# Patient Record
Sex: Male | Born: 1950 | Race: White | Hispanic: No | Marital: Married | Smoking: Current some day smoker
Health system: Southern US, Community
[De-identification: ages and names within clinical notes are randomized; demographics above are authoritative.]

## PROBLEM LIST (undated history)

## (undated) DIAGNOSIS — I10 Essential (primary) hypertension: Secondary | ICD-10-CM

## (undated) HISTORY — PX: COLONOSCOPY: SHX174

## (undated) HISTORY — PX: OTHER SURGICAL HISTORY: SHX169

## (undated) HISTORY — PX: TONSILLECTOMY: SUR1361

## (undated) HISTORY — DX: Essential (primary) hypertension: I10

---

## 2006-02-08 ENCOUNTER — Emergency Department (HOSPITAL_COMMUNITY): Admission: EM | Admit: 2006-02-08 | Discharge: 2006-02-08 | Payer: Self-pay | Admitting: Emergency Medicine

## 2006-02-14 ENCOUNTER — Ambulatory Visit: Payer: Self-pay | Admitting: Orthopedic Surgery

## 2006-05-12 ENCOUNTER — Encounter: Admission: RE | Admit: 2006-05-12 | Discharge: 2006-05-12 | Payer: Self-pay | Admitting: Neurosurgery

## 2006-05-30 ENCOUNTER — Ambulatory Visit (HOSPITAL_COMMUNITY): Admission: RE | Admit: 2006-05-30 | Discharge: 2006-05-30 | Payer: Self-pay | Admitting: Neurosurgery

## 2008-08-29 IMAGING — CT CT CERVICAL SPINE W/O CM
2 of 7 series · 11 of 28 positions shown, 13 images · non-contrast
Comparison: none

CLINICAL DATA: Trauma

[Series 104: cervical spine · axial · 0.27mm/px · z∈[-297,-154]mm · 6 of 321 slices shown, 8 images]
[im 46/321  soft-tissue]
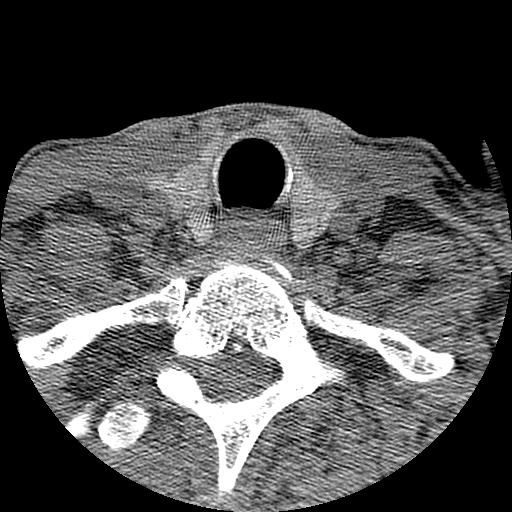
[im 46/321  bone]
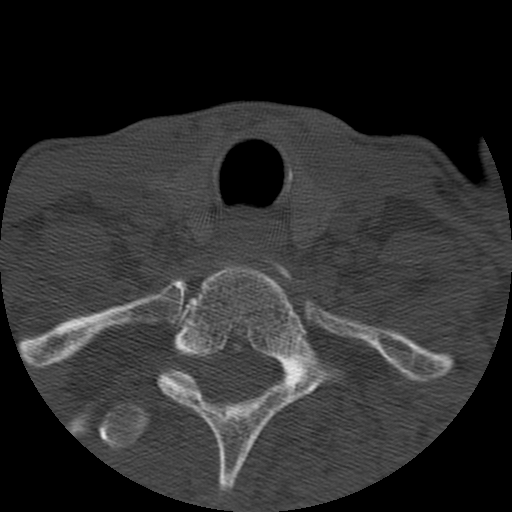
[im 92/321  bone]
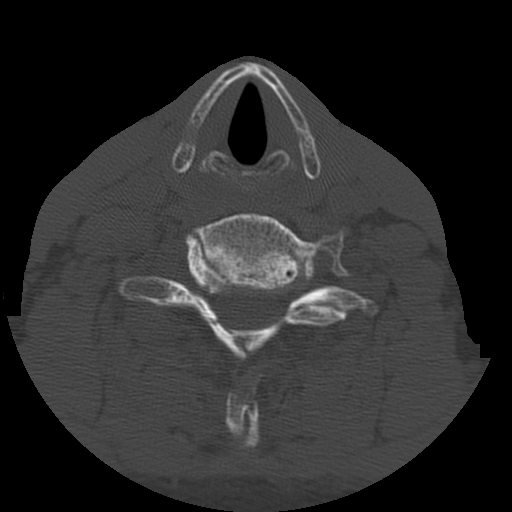
[im 138/321  bone]
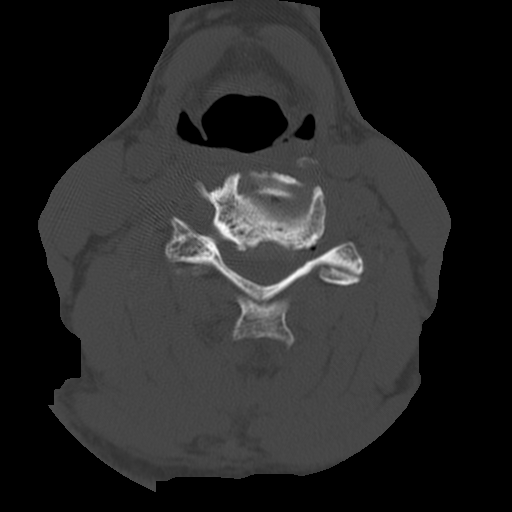
[im 183/321  bone]
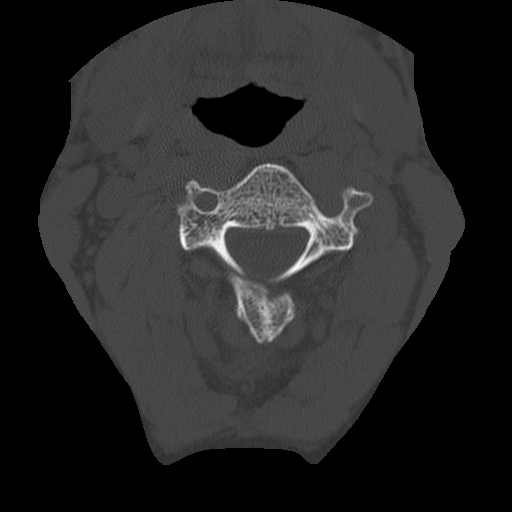
[im 229/321  soft-tissue]
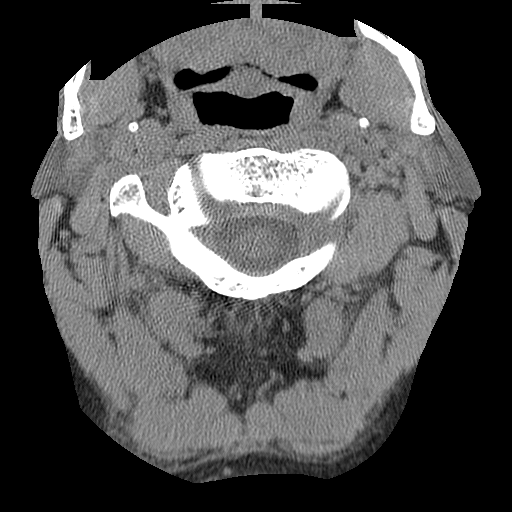
[im 229/321  bone]
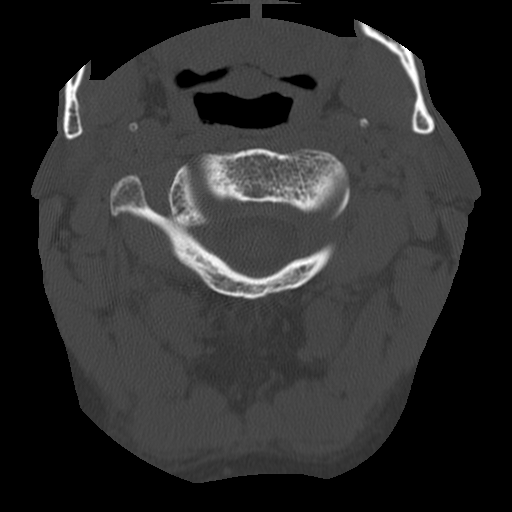
[im 275/321  bone]
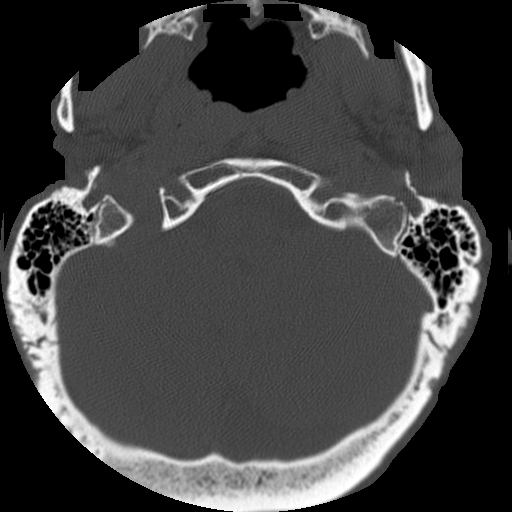

[Series 106: sagittal c spine · sagittal · 0.40mm/px · 5 of 45 slices shown]
[im 8/45  bone]
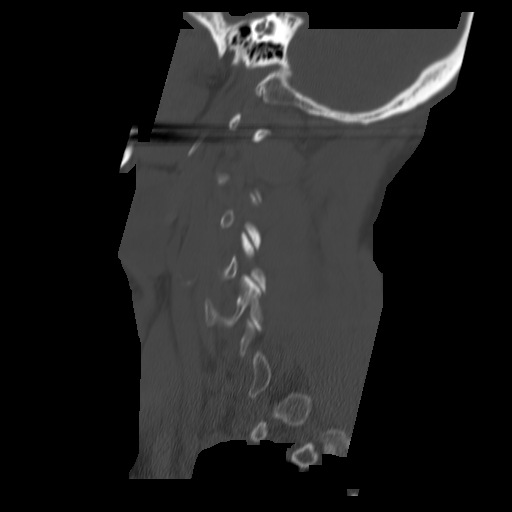
[im 15/45  bone]
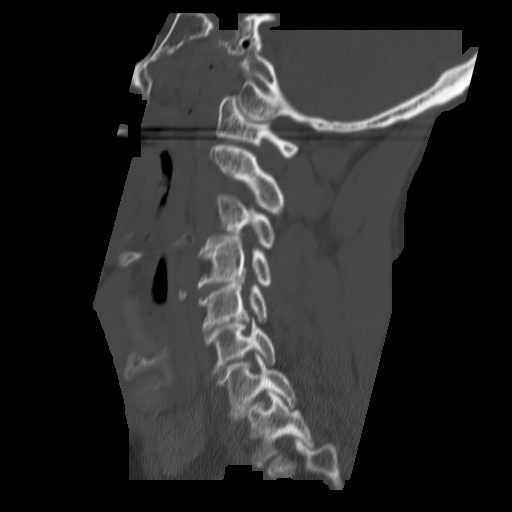
[im 23/45  bone]
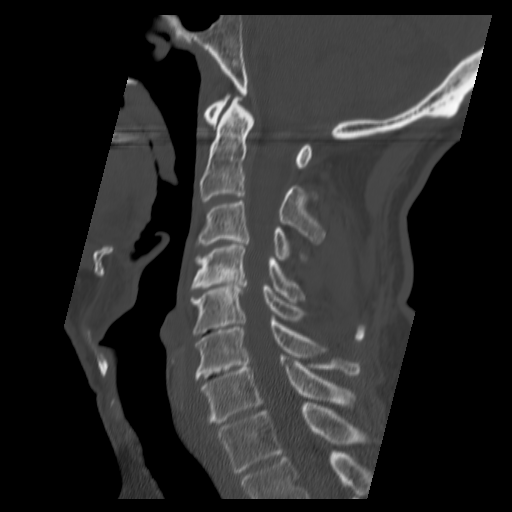
[im 30/45  bone]
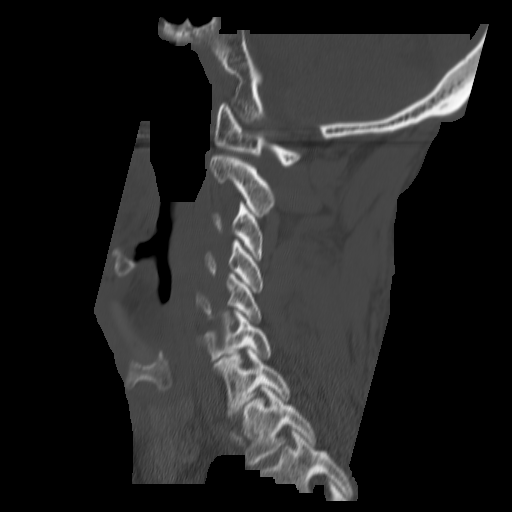
[im 37/45  bone]
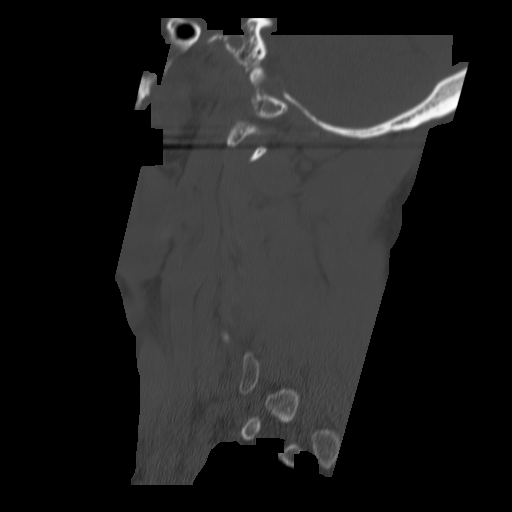

[11 of 28 positions shown; findings below may reference images not displayed]

CT head without contrast:

No previous for comparison. Probable arachnoid cyst posteriorly in the left
posterior cranial fossa. There is no evidence of acute intracranial hemorrhage,
brain edema,  mass effect, or midline shift.   No other intra-axial
abnormalities are seen, and the ventricles and sulci are within normal limits in
size and symmetry.   No abnormal extra-axial fluid collections or masses are
identified.  No skull abnormalities are noted.
IMPRESSION: 1. Negative non-contrast head CT.

CT cervical spine without contrast:

Multidetector axial helical CT with coronal and sagittal reconstructions. Mild
narrowings C3-C4 with a posterior disc bulge. Narrowing of the interspace C4-C5,
with moderate circumferential disc bulge and associated endplate spurring. There
may be a superimposed broad disc protrusion resulting contributing to spinal
stenosis. Uncovertebral hypertrophy results in some encroachment on neural
foraminal bilaterally.

C5-C6 also has somewhat narrowed interspace with circumferential disc bulge and
endplate spurring. Uncovertebral and facet degenerative hypertrophy result in  
neural foraminal encroachment, left greater than right. 

There is fracture of the spinous process of C6 mildly distracted without
extension to the lamina. This is best seen on the sagittal reconstructions.
There is a fracture involving the right lamina and base of the spinous process
of C7, minimally displaced. The fracture line extends across to left lamina.
There is no extension into the facet joint or pedicle. The vertebral body
remains intact. Narrowing of C6-C7 interspace with endplate spurring.
Uncovertebral hypertrophy results in some neural foramina encroachment
bilaterally.

At C7-T1 is mild narrowing and endplate spurring with uncovertebral hypertrophy
resulting in right-sided foraminal encroachment. There is normal alignment of
the cervical spine. No prevertebral soft tissue swelling.
IMPRESSION: 1. Fractures involving spinous process of C6 and posterior elements of C7 as
detailed above.
2. Degenerative disc and facet disease at multiple levels as enumerated above.
This results in spinal stenosis most marked C4-C5, with possible disc protrusion
superimposed on degenerative stenosis.

## 2008-12-18 IMAGING — CR DG CERVICAL SPINE FLEX&EXT ONLY
2 series · 2 of 2 positions shown · non-contrast
Comparison: CT 05/12/2006

CLINICAL DATA: Preop C4-C5, C6-C7 ACDF

CERVICAL SPINE - 2 VIEW

[view not recorded (1 of 2)]
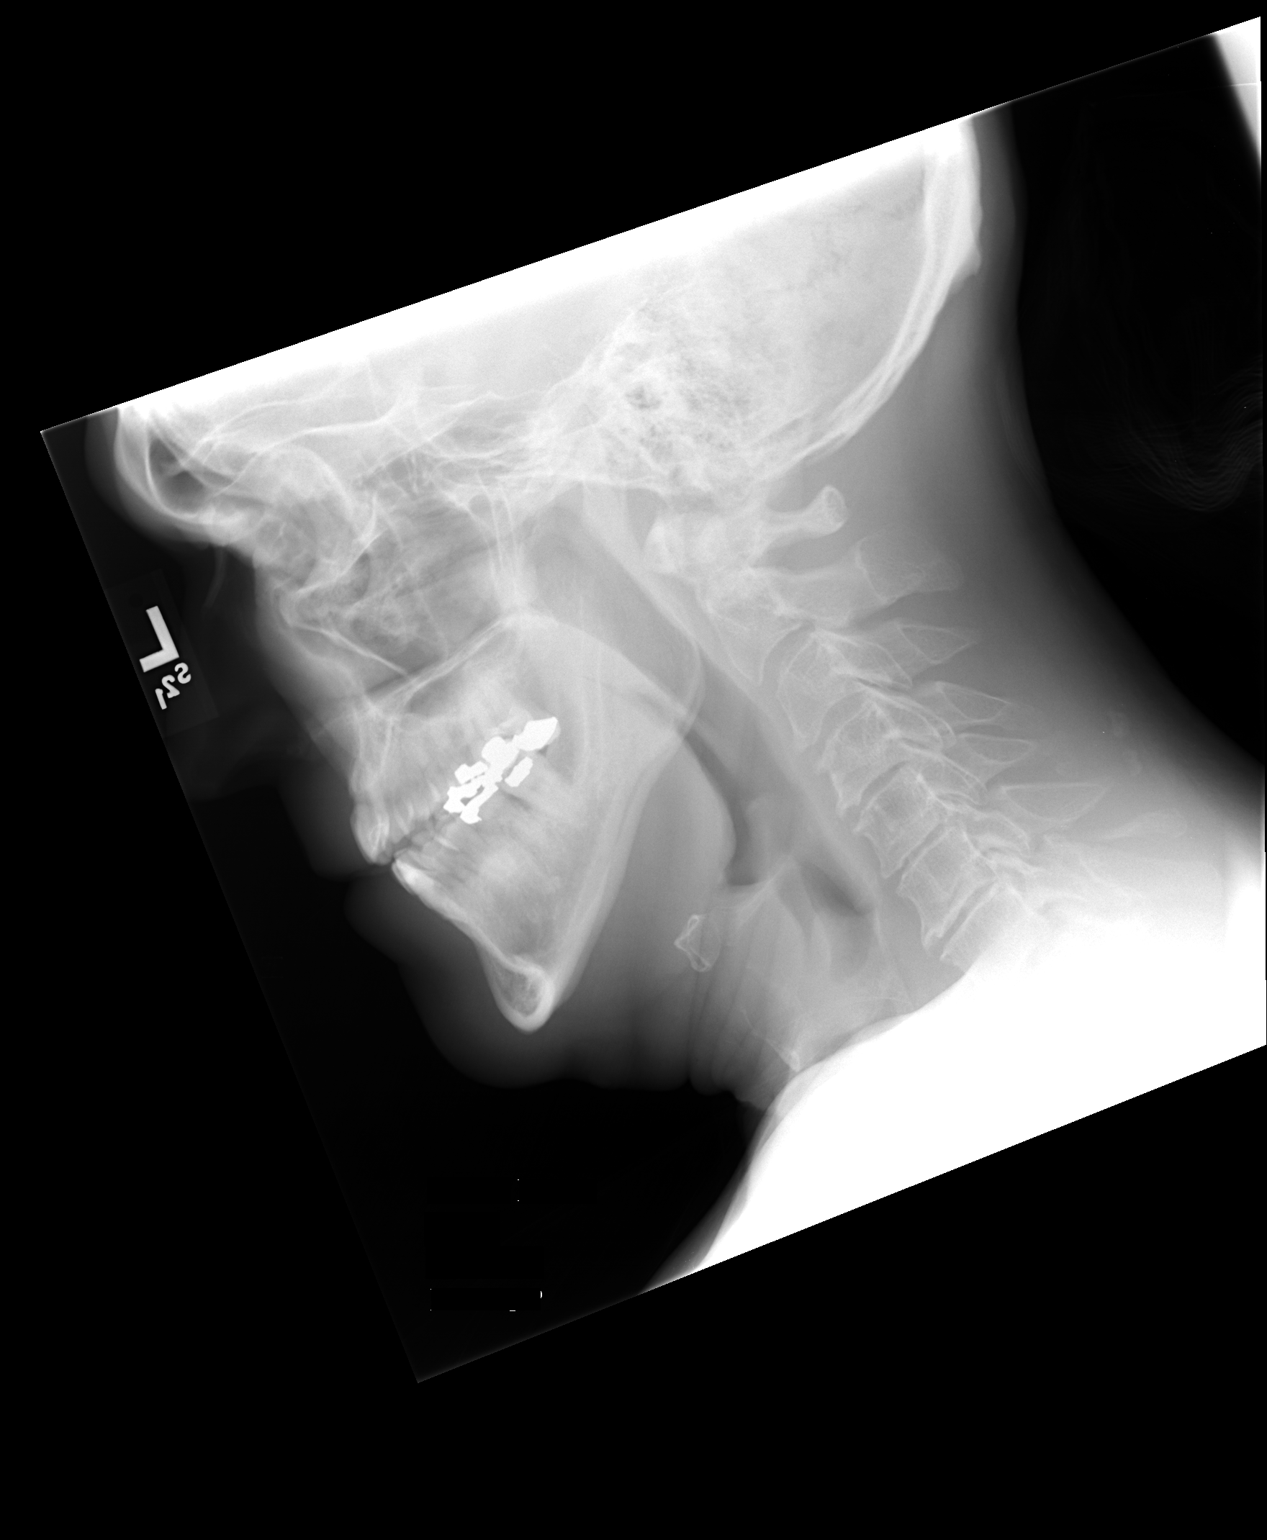

[view not recorded (2 of 2)]
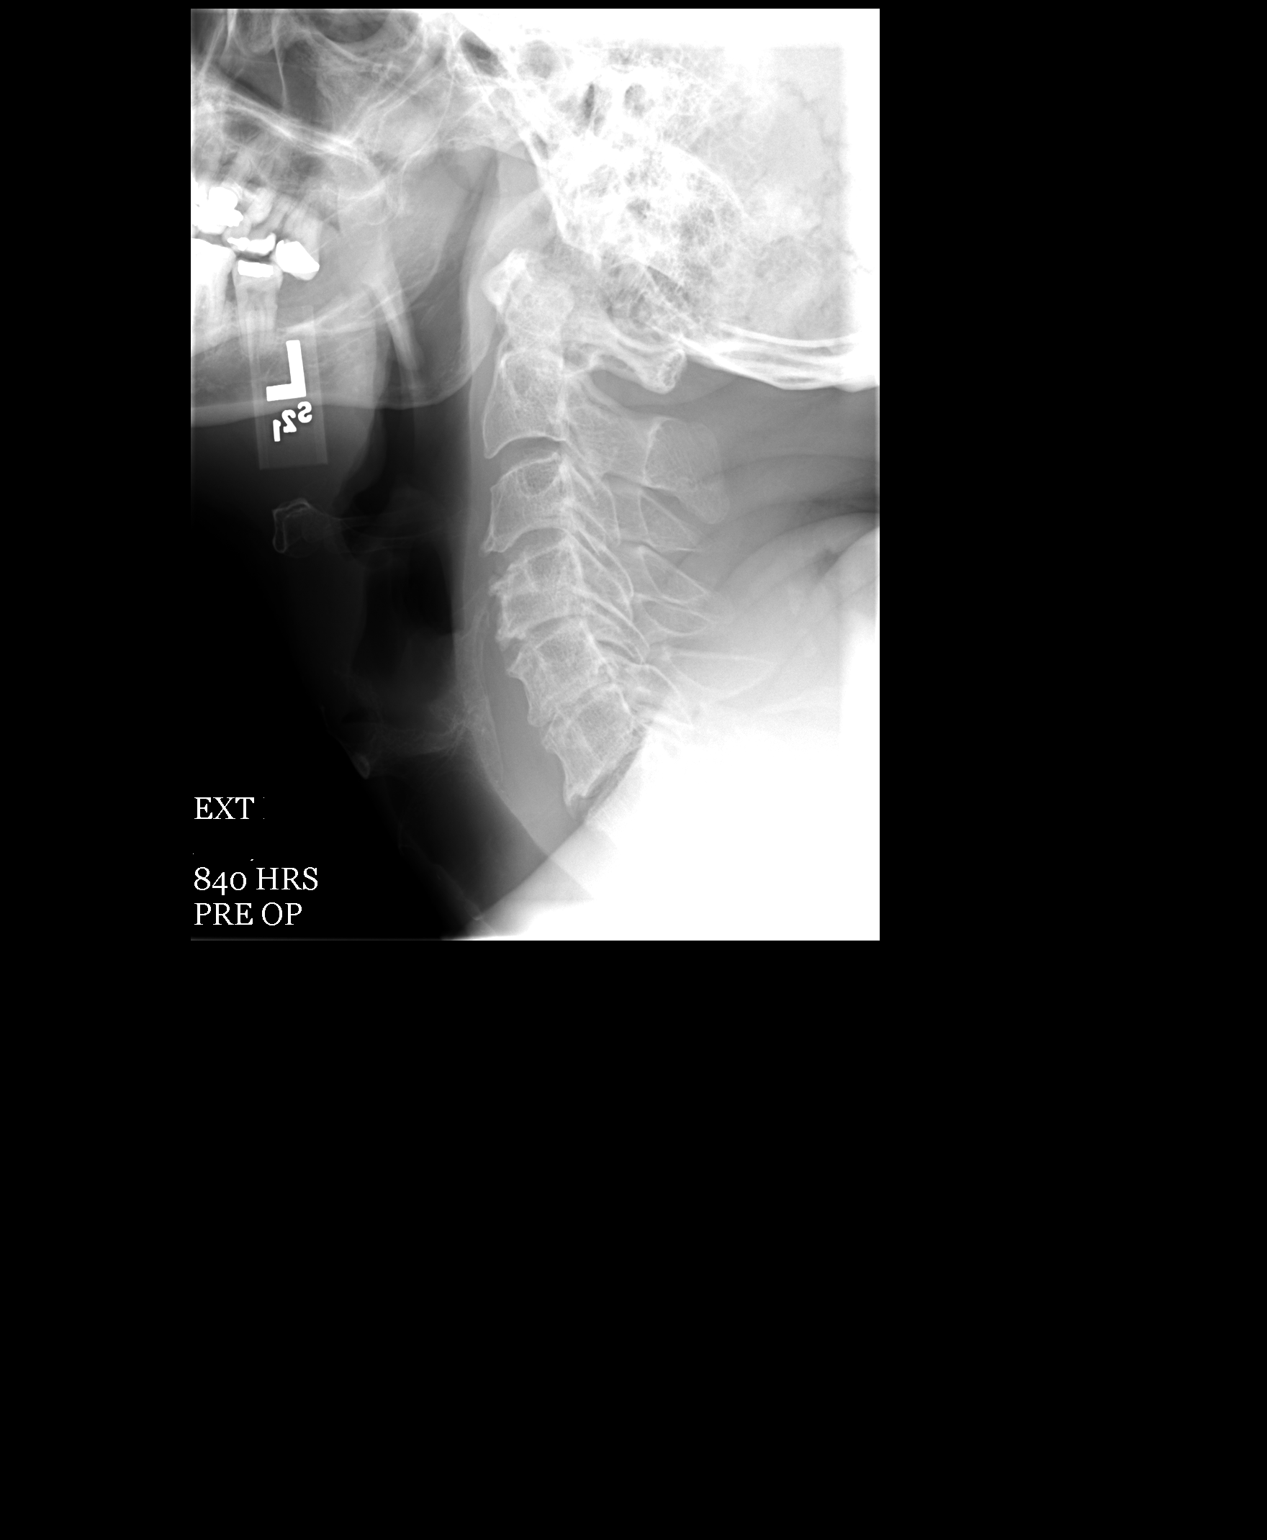

[2 of 2 positions shown; findings below may reference images not displayed]

FINDINGS: Previously noted spinous process and posterior element fractures at
C6 and C7 are noted on the flexion view. There is no instability seen with
limited flexion. There is better extension, again no evidence of instability.
Diffuse spondylosis noted, most pronounced at C4-C5, C5-C6, and C6-C7.

IMPRESSION

No instability with extension or limited flexion.

## 2014-03-03 ENCOUNTER — Encounter: Payer: Self-pay | Admitting: Gastroenterology

## 2019-11-29 ENCOUNTER — Encounter: Payer: Self-pay | Admitting: Internal Medicine

## 2020-01-13 ENCOUNTER — Ambulatory Visit (AMBULATORY_SURGERY_CENTER): Payer: Self-pay | Admitting: *Deleted

## 2020-01-13 ENCOUNTER — Other Ambulatory Visit: Payer: Self-pay

## 2020-01-13 VITALS — Ht 68.0 in | Wt 143.0 lb

## 2020-01-13 DIAGNOSIS — Z1211 Encounter for screening for malignant neoplasm of colon: Secondary | ICD-10-CM

## 2020-01-13 NOTE — Progress Notes (Signed)

## 2020-01-20 ENCOUNTER — Encounter: Payer: Self-pay | Admitting: Internal Medicine

## 2020-01-31 ENCOUNTER — Other Ambulatory Visit: Payer: Self-pay

## 2020-01-31 ENCOUNTER — Ambulatory Visit (AMBULATORY_SURGERY_CENTER): Payer: 59 | Admitting: Internal Medicine

## 2020-01-31 ENCOUNTER — Encounter: Payer: Self-pay | Admitting: Internal Medicine

## 2020-01-31 VITALS — BP 145/76 | HR 71 | Temp 98.1°F | Resp 15 | Ht 68.0 in | Wt 143.0 lb

## 2020-01-31 DIAGNOSIS — Z1211 Encounter for screening for malignant neoplasm of colon: Secondary | ICD-10-CM | POA: Diagnosis present

## 2020-01-31 DIAGNOSIS — D128 Benign neoplasm of rectum: Secondary | ICD-10-CM

## 2020-01-31 DIAGNOSIS — D129 Benign neoplasm of anus and anal canal: Secondary | ICD-10-CM

## 2020-01-31 DIAGNOSIS — D123 Benign neoplasm of transverse colon: Secondary | ICD-10-CM

## 2020-01-31 MED ORDER — SODIUM CHLORIDE 0.9 % IV SOLN: 500.0000 mL | Freq: Once | INTRAVENOUS | Status: DC

## 2020-01-31 NOTE — Progress Notes (Signed)
Called to room to assist during endoscopic procedure.  Patient ID and intended procedure confirmed with present staff. Received instructions for my participation in the procedure from the performing physician.  

## 2020-01-31 NOTE — Op Note (Signed)
[place] Patient Name: Logan Carpenter Procedure Date: 01/31/2020 7:57 AM MRN: [id number] Endoscopist: Gatha Mayer , MD Age: 69 Referring MD:  Date of Birth: [birth year] Gender: Male Account #: [id number] Procedure:                Colonoscopy Indications:              Screening for colorectal malignant neoplasm, Last                            colonoscopy: 2005 Medicines:                Propofol per Anesthesia, Monitored Anesthesia Care Procedure:                Pre-Anesthesia Assessment:                           - Prior to the procedure, a History and Physical                            was performed, and patient medications and                            allergies were reviewed. The patient's tolerance of                            previous anesthesia was also reviewed. The risks                            and benefits of the procedure and the sedation                            options and risks were discussed with the patient.                            All questions were answered, and informed consent                            was obtained. Prior Anticoagulants: The patient has                            taken no previous anticoagulant or antiplatelet                            agents. ASA Grade Assessment: II - A patient with                            mild systemic disease. After reviewing the risks                            and benefits, the patient was deemed in                            satisfactory condition to undergo the procedure.  After obtaining informed consent, the colonoscope                            was passed under direct vision. Throughout the                            procedure, the patient's blood pressure, pulse, and                            oxygen saturations were monitored continuously. The                            Olympus PFC-[id number] (#[id number]) Colonoscope was                            introduced through the  anus and advanced to the the                            cecum, identified by appendiceal orifice and                            ileocecal valve. The colonoscopy was performed                            without difficulty. The patient tolerated the                            procedure well. The quality of the bowel                            preparation was adequate. The ileocecal valve,                            appendiceal orifice, and rectum were photographed.                            The bowel preparation used was Miralax via split                            dose instruction. Scope In: 8:14:34 AM Scope Out: 8:44:54 AM Scope Withdrawal Time: 0 hours 23 minutes 16 seconds  Total Procedure Duration: 0 hours 30 minutes 20 seconds  Findings:                 The perianal and digital rectal examinations were                            normal. Pertinent negatives include normal prostate                            (size, shape, and consistency).                           Two sessile polyps were found in the rectum and  transverse colon. The polyps were diminutive in                            size. These polyps were removed with a cold snare.                            Resection and retrieval were complete. Verification                            of patient identification for the specimen was                            done. Estimated blood loss was minimal.                           Multiple diverticula were found in the sigmoid                            colon.                           The exam was otherwise without abnormality.                           No additional abnormalities were found on                            retroflexion. Complications:            No immediate complications. Estimated Blood Loss:     Estimated blood loss was minimal. Impression:               - Two diminutive polyps in the rectum and in the                            transverse  colon, removed with a cold snare.                            Resected and retrieved.                           - Diverticulosis in the sigmoid colon.                           - The examination was otherwise normal. Recommendation:           - Patient has a contact number available for                            emergencies. The signs and symptoms of potential                            delayed complications were discussed with the                            patient. Return to normal activities tomorrow.  Written discharge instructions were provided to the                            patient.                           - Resume previous diet.                           - Continue present medications.                           - Repeat colonoscopy is recommended. The                            colonoscopy date will be determined after pathology                            results from today's exam become available for                            review. Gatha Mayer, MD 01/31/2020 8:53:30 AM This report has been signed electronically.

## 2020-01-31 NOTE — Progress Notes (Signed)
Report to PACU, RN, vss, BBS= Clear.  

## 2020-01-31 NOTE — Progress Notes (Signed)
Vital signs checked by:CW  The patient states no changes in medical or surgical history since pre-visit screening on 01/13/2020.

## 2020-01-31 NOTE — Patient Instructions (Addendum)
I found and removed 2 tiny polyps that look benign.  I will let you know pathology results and when to have another routine colonoscopy by mail and/or My Chart.  You also have a condition called diverticulosis - common and not usually a problem. Please read the handout provided.  I appreciate the opportunity to care for you. Gatha Mayer, MD, FACG  YOU HAD AN ENDOSCOPIC PROCEDURE TODAY AT [facility]:   Refer to the procedure report that was given to you for any specific questions about what was found during the examination.  If the procedure report does not answer your questions, please call your gastroenterologist to clarify.  If you requested that your care partner not be given the details of your procedure findings, then the procedure report has been included in a sealed envelope for you to review at your convenience later.  YOU SHOULD EXPECT: Some feelings of bloating in the abdomen. Passage of more gas than usual.  Walking can help get rid of the air that was put into your GI tract during the procedure and reduce the bloating. If you had a lower endoscopy (such as a colonoscopy or flexible sigmoidoscopy) you may notice spotting of blood in your stool or on the toilet paper. If you underwent a bowel prep for your procedure, you may not have a normal bowel movement for a few days.  Please Note:  You might notice some irritation and congestion in your nose or some drainage.  This is from the oxygen used during your procedure.  There is no need for concern and it should clear up in a day or so.  SYMPTOMS TO REPORT IMMEDIATELY:   Following lower endoscopy (colonoscopy or flexible sigmoidoscopy):  Excessive amounts of blood in the stool  Significant tenderness or worsening of abdominal pains  Swelling of the abdomen that is new, acute  Fever of 100F or higher   Following upper endoscopy (EGD)  Vomiting of blood or coffee ground material  New chest pain or pain under  the shoulder blades  Painful or persistently difficult swallowing  New shortness of breath  Fever of 100F or higher  Black, tarry-looking stools  For urgent or emergent issues, a gastroenterologist can be reached at any hour by calling [phone]. Do not use MyChart messaging for urgent concerns.    DIET:  We do recommend a small meal at first, but then you may proceed to your regular diet.  Drink plenty of fluids but you should avoid alcoholic beverages for 24 hours.  ACTIVITY:  You should plan to take it easy for the rest of today and you should NOT DRIVE or use heavy machinery until tomorrow (because of the sedation medicines used during the test).    FOLLOW UP: Our staff will call the number listed on your records 48-72 hours following your procedure to check on you and address any questions or concerns that you may have regarding the information given to you following your procedure. If we do not reach you, we will leave a message.  We will attempt to reach you two times.  During this call, we will ask if you have developed any symptoms of COVID 19. If you develop any symptoms (ie: fever, flu-like symptoms, shortness of breath, cough etc.) before then, please call [phone].  If you test positive for Covid 19 in the 2 weeks post procedure, please call and report this information to [place].    If any biopsies were taken you  will be contacted by phone or by letter within the next 1-3 weeks.  Please call us at [phone] if you have not heard about the biopsies in 3 weeks.    SIGNATURES/CONFIDENTIALITY: You and/or your care partner have signed paperwork which will be entered into your electronic medical record.  These signatures attest to the fact that that the information above on your After Visit Summary has been reviewed and is understood.  Full responsibility of the confidentiality of this discharge information lies with you and/or your care-partner.

## 2020-02-01 ENCOUNTER — Telehealth: Payer: Self-pay

## 2020-02-01 NOTE — Telephone Encounter (Signed)
LVM

## 2020-02-03 ENCOUNTER — Encounter: Payer: Self-pay | Admitting: Internal Medicine

## 2020-02-03 DIAGNOSIS — Z860101 Personal history of adenomatous and serrated colon polyps: Secondary | ICD-10-CM

## 2020-02-03 DIAGNOSIS — Z86010 Personal history of colonic polyps: Secondary | ICD-10-CM

## 2020-02-03 HISTORY — DX: Personal history of colonic polyps: Z86.010

## 2020-02-03 HISTORY — DX: Personal history of adenomatous and serrated colon polyps: Z86.0101

## 2023-10-14 ENCOUNTER — Other Ambulatory Visit: Payer: Self-pay

## 2023-10-14 ENCOUNTER — Emergency Department

## 2023-10-14 ENCOUNTER — Inpatient Hospital Stay
Admission: EM | Admit: 2023-10-14 | Discharge: 2023-10-16 | DRG: 200 | Disposition: A | Discharge status: Home / Self Care | Source: Ambulatory Visit | Attending: Student | Admitting: Student

## 2023-10-14 DIAGNOSIS — Z66 Do not resuscitate: Secondary | ICD-10-CM | POA: Diagnosis present

## 2023-10-14 DIAGNOSIS — S2241XA Multiple fractures of ribs, right side, initial encounter for closed fracture: Secondary | ICD-10-CM | POA: Diagnosis present

## 2023-10-14 DIAGNOSIS — Z79899 Other long term (current) drug therapy: Secondary | ICD-10-CM | POA: Diagnosis not present

## 2023-10-14 DIAGNOSIS — Z860101 Personal history of adenomatous and serrated colon polyps: Secondary | ICD-10-CM

## 2023-10-14 DIAGNOSIS — E860 Dehydration: Secondary | ICD-10-CM | POA: Diagnosis present

## 2023-10-14 DIAGNOSIS — J432 Centrilobular emphysema: Secondary | ICD-10-CM | POA: Diagnosis present

## 2023-10-14 DIAGNOSIS — D72829 Elevated white blood cell count, unspecified: Secondary | ICD-10-CM | POA: Diagnosis not present

## 2023-10-14 DIAGNOSIS — I10 Essential (primary) hypertension: Secondary | ICD-10-CM | POA: Diagnosis present

## 2023-10-14 DIAGNOSIS — S270XXA Traumatic pneumothorax, initial encounter: Secondary | ICD-10-CM | POA: Diagnosis present

## 2023-10-14 DIAGNOSIS — Z87891 Personal history of nicotine dependence: Secondary | ICD-10-CM

## 2023-10-14 DIAGNOSIS — R918 Other nonspecific abnormal finding of lung field: Secondary | ICD-10-CM | POA: Diagnosis present

## 2023-10-14 DIAGNOSIS — Y92018 Other place in single-family (private) house as the place of occurrence of the external cause: Secondary | ICD-10-CM

## 2023-10-14 DIAGNOSIS — Y93E9 Activity, other interior property and clothing maintenance: Secondary | ICD-10-CM | POA: Diagnosis not present

## 2023-10-14 DIAGNOSIS — J90 Pleural effusion, not elsewhere classified: Secondary | ICD-10-CM | POA: Diagnosis present

## 2023-10-14 DIAGNOSIS — J939 Pneumothorax, unspecified: Secondary | ICD-10-CM | POA: Diagnosis present

## 2023-10-14 DIAGNOSIS — R079 Chest pain, unspecified: Secondary | ICD-10-CM | POA: Diagnosis present

## 2023-10-14 DIAGNOSIS — D72828 Other elevated white blood cell count: Secondary | ICD-10-CM | POA: Diagnosis present

## 2023-10-14 DIAGNOSIS — W1839XA Other fall on same level, initial encounter: Secondary | ICD-10-CM | POA: Diagnosis present

## 2023-10-14 DIAGNOSIS — S2241XD Multiple fractures of ribs, right side, subsequent encounter for fracture with routine healing: Principal | ICD-10-CM

## 2023-10-14 LAB — CBC WITH DIFFERENTIAL/PLATELET
Abs Immature Granulocytes: 0.08 K/uL — ABNORMAL HIGH (ref 0.00–0.07)
Basophils Absolute: 0 K/uL (ref 0.0–0.1)
Basophils Relative: 0 %
Eosinophils Absolute: 0 K/uL (ref 0.0–0.5)
Eosinophils Relative: 0 %
HCT: 43.8 % (ref 39.0–52.0)
Hemoglobin: 15 g/dL (ref 13.0–17.0)
Immature Granulocytes: 1 %
Lymphocytes Relative: 3 %
Lymphs Abs: 0.5 K/uL — ABNORMAL LOW (ref 0.7–4.0)
MCH: 32.4 pg (ref 26.0–34.0)
MCHC: 34.2 g/dL (ref 30.0–36.0)
MCV: 94.6 fL (ref 80.0–100.0)
Monocytes Absolute: 0.3 K/uL (ref 0.1–1.0)
Monocytes Relative: 2 %
Neutro Abs: 14.2 K/uL — ABNORMAL HIGH (ref 1.7–7.7)
Neutrophils Relative %: 94 %
Platelets: 167 K/uL (ref 150–400)
RBC: 4.63 MIL/uL (ref 4.22–5.81)
RDW: 13.2 % (ref 11.5–15.5)
WBC: 15.1 K/uL — ABNORMAL HIGH (ref 4.0–10.5)
nRBC: 0 % (ref 0.0–0.2)

## 2023-10-14 LAB — URINALYSIS, COMPLETE (UACMP) WITH MICROSCOPIC
Bacteria, UA: NONE SEEN
Bilirubin Urine: NEGATIVE
Glucose, UA: NEGATIVE mg/dL
Hgb urine dipstick: NEGATIVE
Ketones, ur: NEGATIVE mg/dL
Leukocytes,Ua: NEGATIVE
Nitrite: NEGATIVE
Protein, ur: NEGATIVE mg/dL
Specific Gravity, Urine: 1.041 — ABNORMAL HIGH (ref 1.005–1.030)
pH: 5 (ref 5.0–8.0)

## 2023-10-14 LAB — BASIC METABOLIC PANEL WITH GFR
Anion gap: 9 (ref 5–15)
BUN: 29 mg/dL — ABNORMAL HIGH (ref 8–23)
CO2: 28 mmol/L (ref 22–32)
Calcium: 9.7 mg/dL (ref 8.9–10.3)
Chloride: 102 mmol/L (ref 98–111)
Creatinine, Ser: 0.89 mg/dL (ref 0.61–1.24)
GFR, Estimated: >60 mL/min (ref >60)
Glucose, Bld: 129 mg/dL — ABNORMAL HIGH (ref 70–99)
Potassium: 4.2 mmol/L (ref 3.5–5.1)
Sodium: 139 mmol/L (ref 135–145)

## 2023-10-14 MED ORDER — LIDOCAINE-EPINEPHRINE 2 %-1:100000 IJ SOLN
20.0000 mL | Freq: Once | INTRAMUSCULAR | Status: AC
  Administered 2023-10-14: 20 mL
  Filled 2023-10-14: qty 1

## 2023-10-14 MED ORDER — MORPHINE SULFATE (PF) 2 MG/ML IV SOLN
2.0000 mg | Freq: Once | INTRAVENOUS | Status: DC
  Filled 2023-10-14: qty 1

## 2023-10-14 MED ORDER — MORPHINE SULFATE (PF) 4 MG/ML IV SOLN
4.0000 mg | Freq: Once | INTRAVENOUS | Status: AC
  Administered 2023-10-14: 4 mg via INTRAVENOUS
  Filled 2023-10-14: qty 1

## 2023-10-14 MED ORDER — MAGNESIUM HYDROXIDE 400 MG/5ML PO SUSP: 30.0000 mL | Freq: Every day | ORAL | Status: DC | PRN

## 2023-10-14 MED ORDER — ENOXAPARIN SODIUM 40 MG/0.4ML IJ SOSY
40.0000 mg | PREFILLED_SYRINGE | INTRAMUSCULAR | Status: DC
  Administered 2023-10-14 – 2023-10-15 (×2): 40 mg via SUBCUTANEOUS
  Filled 2023-10-14 (×2): qty 0.4

## 2023-10-14 MED ORDER — TRAZODONE HCL 50 MG PO TABS
25.0000 mg | ORAL_TABLET | Freq: Every evening | ORAL | Status: DC | PRN
  Administered 2023-10-15: 25 mg via ORAL
  Filled 2023-10-14: qty 1

## 2023-10-14 MED ORDER — ONDANSETRON HCL 4 MG PO TABS: 4.0000 mg | ORAL_TABLET | Freq: Four times a day (QID) | ORAL | Status: DC | PRN

## 2023-10-14 MED ORDER — SODIUM CHLORIDE 0.9 % IV SOLN: INTRAVENOUS | Status: DC

## 2023-10-14 MED ORDER — ONDANSETRON HCL 4 MG/2ML IJ SOLN: 4.0000 mg | Freq: Four times a day (QID) | INTRAMUSCULAR | Status: DC | PRN

## 2023-10-14 MED ORDER — OXYCODONE-ACETAMINOPHEN 5-325 MG PO TABS
1.0000 | ORAL_TABLET | ORAL | Status: DC | PRN
  Administered 2023-10-15: 2 via ORAL
  Filled 2023-10-14: qty 2

## 2023-10-14 MED ORDER — MORPHINE SULFATE (PF) 2 MG/ML IV SOLN: 2.0000 mg | INTRAVENOUS | Status: DC | PRN

## 2023-10-14 MED ORDER — IOHEXOL 300 MG/ML  SOLN
75.0000 mL | Freq: Once | INTRAMUSCULAR | Status: AC | PRN
  Administered 2023-10-14: 75 mL via INTRAVENOUS

## 2023-10-14 MED ORDER — B COMPLEX VITAMINS PO CAPS: 1.0000 | ORAL_CAPSULE | Freq: Every day | ORAL | Status: DC

## 2023-10-14 MED ORDER — LISINOPRIL 5 MG PO TABS
5.0000 mg | ORAL_TABLET | Freq: Every day | ORAL | Status: DC
Start: 2023-10-15 — End: 2023-10-15
  Administered 2023-10-15: 5 mg via ORAL
  Filled 2023-10-14: qty 1

## 2023-10-14 MED ORDER — B COMPLEX-C PO TABS
1.0000 | ORAL_TABLET | Freq: Every day | ORAL | Status: DC
  Administered 2023-10-15 – 2023-10-16 (×2): 1 via ORAL
  Filled 2023-10-14 (×2): qty 1

## 2023-10-14 MED ORDER — ACETAMINOPHEN 325 MG PO TABS: 650.0000 mg | ORAL_TABLET | Freq: Four times a day (QID) | ORAL | Status: DC | PRN

## 2023-10-14 MED ORDER — ADULT MULTIVITAMIN W/MINERALS CH
1.0000 | ORAL_TABLET | Freq: Every day | ORAL | Status: DC
  Administered 2023-10-15 – 2023-10-16 (×2): 1 via ORAL
  Filled 2023-10-14 (×2): qty 1

## 2023-10-14 MED ORDER — ACETAMINOPHEN 650 MG RE SUPP: 650.0000 mg | Freq: Four times a day (QID) | RECTAL | Status: DC | PRN

## 2023-10-14 MED ORDER — HYDROCHLOROTHIAZIDE 25 MG PO TABS
25.0000 mg | ORAL_TABLET | Freq: Every day | ORAL | Status: DC
Start: 2023-10-15 — End: 2023-10-15
  Administered 2023-10-15: 25 mg via ORAL
  Filled 2023-10-14: qty 1

## 2023-10-14 NOTE — H&P (Signed)
 [place]   PATIENT NAME: Logan Carpenter    MR#:  [id number]  DATE OF BIRTH:  11-07-1950  DATE OF ADMISSION:  10/14/2023  PRIMARY CARE PHYSICIAN: Lelon Norleen ORN., MD   Patient is coming from: Home  REQUESTING/REFERRING PHYSICIAN: Viviann Mungo, MD  CHIEF COMPLAINT:   Chief Complaint  Patient presents with   Rib Injury   Fall   Chest Injury    HISTORY OF PRESENT ILLNESS:  CAILAN GENERAL is a 73 y.o. male with medical history significant for essential hypertension, who presented to the emergency room with persistent right-sided chest pain mainly with movement and postural changes without significant dyspnea or cough or wheezing.  No fever or chills.  No nausea or vomiting or abdominal pain.  He had a mechanical fall on Friday when he landed on his right side.  He wanted to wait a few days to see if pain will resolve.  He denied any paresthesias or focal muscle weakness.  No syncope or syncope.  No dysuria, oliguria or hematuria or flank pain.  No other bleeding diathesis.  ED Course: When he came to the ER, BP was 160/77 with otherwise normal vital signs.  Labs revealed BUN of 29 and otherwise normal BMP.  CBC showed leukocytosis 15.1 with neutrophilia EKG as reviewed by me : None Imaging: Chest and right rib x-ray showed the following: 1. Right-sided pneumothorax, volume estimated 50%. No tension effect or midline shift. 2. Displaced right lateral third through eighth rib fractures. 3. Trace right effusion.  Chest CT with contrast revealed the following: 1. Right-sided pneumothorax, volume estimated 25%. No tension effect or midline shift. 2. Trace right free-flowing pleural effusion. 3. Numerous right-sided rib fractures as above. 4. Multiple pulmonary nodules. Most significant: Right upper lobe solid pulmonary nodule measuring 10 mm. Per Fleischner Society Guidelines, consider a non-contrast Chest CT at 3 months, a PET/CT, or tissue sampling. These guidelines do  not apply to immunocompromised patients and patients with cancer. Follow up in patients with significant comorbidities as clinically warranted. For lung cancer screening, adhere to Lung-RADS guidelines. Reference: Radiology. 2017; 284(1):228-43. 5. Aortic Atherosclerosis (ICD10-I70.0). Coronary artery atherosclerosis. 6. Nonspecific low-attenuation bilateral adrenal gland thickening, most likely representing adrenal hyperplasia.  Post chest tube insertion portable chest x-ray showed the following: Interim placement of right-sided chest tube with decreased right pneumothorax. Suspect small residual pneumothorax at the right base/CP angle.  The patient was given 4 mg IV morphine  sulfate.  Dr. Aleskerov was notified about the patient.  He will be admitted to a progressive unit bed for further evaluation and management.  PAST MEDICAL HISTORY:   Past Medical History:  Diagnosis Date   Hx of adenomatous colonic polyps 02/03/2020   Hypertension     PAST SURGICAL HISTORY:   Past Surgical History:  Procedure Laterality Date   COLONOSCOPY     neck surgery     broke neck- has screw and bone graft    TONSILLECTOMY     age 20     SOCIAL HISTORY:   Social History   Tobacco Use   Smoking status: Some Days    Types: Cigars   Smokeless tobacco: Former  Substance Use Topics   Alcohol use: Yes    Comment: social     FAMILY HISTORY:   Family History  Problem Relation Age of Onset   Colon cancer Neg Hx    Colon polyps Neg Hx    Esophageal cancer Neg Hx    Rectal  cancer Neg Hx    Stomach cancer Neg Hx     DRUG ALLERGIES:  No Known Allergies  REVIEW OF SYSTEMS:   ROS As per history of present illness. All pertinent systems were reviewed above. Constitutional, HEENT, cardiovascular, respiratory, GI, GU, musculoskeletal, neuro, psychiatric, endocrine, integumentary and hematologic systems were reviewed and are otherwise negative/unremarkable except for positive findings  mentioned above in the HPI.   MEDICATIONS AT HOME:   Prior to Admission medications   Medication Sig Start Date End Date Taking? Authorizing Provider  b complex vitamins capsule Take 1 capsule by mouth daily.    [provider]  hydrochlorothiazide  (HYDRODIURIL ) 25 MG tablet Take 25 mg by mouth daily. 12/08/19   [provider]  lisinopril  (ZESTRIL ) 40 MG tablet  11/30/19   [provider]  Melatonin 10 MG CAPS Take by mouth.    [provider]  Multiple Vitamin (MULTIVITAMIN) tablet Take 1 tablet by mouth daily.    [provider]      VITAL SIGNS:  Blood pressure 134/66, pulse 63, temperature 98 F (36.7 C), resp. rate 19, height 5' 9 (1.753 m), weight 61.2 kg, SpO2 100%.  PHYSICAL EXAMINATION:  Physical Exam  GENERAL:  73 y.o.-year-old patient lying in the bed with no acute distress.  EYES: Pupils equal, round, reactive to light and accommodation. No scleral icterus. Extraocular muscles intact.  HEENT: Head atraumatic, normocephalic. Oropharynx and nasopharynx clear.  NECK:  Supple, no jugular venous distention. No thyroid enlargement, no tenderness.  LUNGS: Normal breath sounds bilaterally, no wheezing, rales,rhonchi or crepitation. No use of accessory muscles of respiration.  CARDIOVASCULAR: Regular rate and rhythm, S1, S2 normal. No murmurs, rubs, or gallops.  ABDOMEN: Soft, nondistended, nontender. Bowel sounds present. No organomegaly or mass.  EXTREMITIES: No pedal edema, cyanosis, or clubbing.  NEUROLOGIC: Cranial nerves II through XII are intact. Muscle strength 5/5 in all extremities. Sensation intact. Gait not checked. Musculoskeletal: Right-sided lateral rib tenderness and 3rd through 8th ribs PSYCHIATRIC: The patient is alert and oriented x 3.  Normal affect and good eye contact. SKIN: No obvious rash, lesion, or ulcer.   LABORATORY PANEL:   CBC Recent Labs  Lab 10/14/23 1410  WBC 15.1*  HGB 15.0  HCT 43.8  PLT  167   ------------------------------------------------------------------------------------------------------------------  Chemistries  Recent Labs  Lab 10/14/23 1410  NA 139  K 4.2  CL 102  CO2 28  GLUCOSE 129*  BUN 29*  CREATININE 0.89  CALCIUM 9.7   ------------------------------------------------------------------------------------------------------------------  Cardiac Enzymes No results for input(s): TROPONINI in the last 168 hours. ------------------------------------------------------------------------------------------------------------------  RADIOLOGY:  DG Chest Portable 1 View Result Date: 10/14/2023 CLINICAL DATA:  Chest tube pneumothorax EXAM: PORTABLE CHEST 1 VIEW COMPARISON:  10/14/2023 FINDINGS: Interim placement of right-sided chest tube with tip over the right lower chest. Decreased right pneumothorax apical laterally. Suspect residual pneumothorax at the right base/CP angle as evidence by relative hyperlucency. Normal cardiac size. Aortic atherosclerosis. Multiple right-sided rib fractures IMPRESSION: Interim placement of right-sided chest tube with decreased right pneumothorax. Suspect small residual pneumothorax at the right base/CP angle. Electronically Signed   By: Luke Bun M.D.   On: 10/14/2023 20:37   CT Chest W Contrast Result Date: 10/14/2023 CLINICAL DATA:  [name] 3 days ago, pneumothorax and right rib fracture seen on earlier x-ray EXAM: CT CHEST WITH CONTRAST TECHNIQUE: Multidetector CT imaging of the chest was performed during intravenous contrast administration. RADIATION DOSE REDUCTION: This exam was performed according to the departmental dose-optimization program which  includes automated exposure control, adjustment of the mA and/or kV according to patient size and/or use of iterative reconstruction technique. CONTRAST:  75mL OMNIPAQUE  IOHEXOL  300 MG/ML  SOLN COMPARISON:  9 in 925 FINDINGS: Cardiovascular: The heart is unremarkable without pericardial  effusion. No evidence of thoracic aortic aneurysm or dissection. No evidence of vascular injury. Atherosclerosis of the aorta and coronary vasculature. Mediastinum/Nodes: No enlarged mediastinal, hilar, or axillary lymph nodes. Thyroid gland, trachea, and esophagus demonstrate no significant findings. Lungs/Pleura: Right-sided pneumothorax is again identified, volume estimated 25%. No tension effect or midline shift. Trace free-flowing right pleural effusion. Dependent atelectasis within the right lower lobe. Multiple pulmonary nodules as follows: Right middle lobe, subpleural, image 108/3, 12 x 10 x 4 mm. Right middle lobe, subpleural, image 126/3, 6 x 3 x 5 mm. Right upper lobe, image 89/3, 11 x 11 x 8 mm. The left lung is clear, with no left-sided effusion or left pneumothorax. The central airways are patent. Upper Abdomen: No acute abnormality. Thickening of the bilateral adrenal glands, measuring 1.5 cm on the right with attenuation of 37 HU and 3.3 cm on the left with attenuation of 32 HU. Musculoskeletal: There are minimally displaced right anterior second through fifth rib fractures. There are displaced right lateral fourth through eighth rib fractures. Fractures are also seen through the right posterior second through eighth ribs at the costovertebral junctions. No left-sided rib fractures. Mild diffuse cervical and thoracic spondylosis. Reconstructed images demonstrate no additional findings. IMPRESSION: 1. Right-sided pneumothorax, volume estimated 25%. No tension effect or midline shift. 2. Trace right free-flowing pleural effusion. 3. Numerous right-sided rib fractures as above. 4. Multiple pulmonary nodules. Most significant: Right upper lobe solid pulmonary nodule measuring 10 mm. Per Fleischner Society Guidelines, consider a non-contrast Chest CT at 3 months, a PET/CT, or tissue sampling. These guidelines do not apply to immunocompromised patients and patients with cancer. Follow up in patients with  significant comorbidities as clinically warranted. For lung cancer screening, adhere to Lung-RADS guidelines. Reference: Radiology. 2017; 284(1):228-43. 5. Aortic Atherosclerosis (ICD10-I70.0). Coronary artery atherosclerosis. 6. Nonspecific low-attenuation bilateral adrenal gland thickening, most likely representing adrenal hyperplasia. Electronically Signed   By: Ozell Daring M.D.   On: 10/14/2023 18:18   DG Ribs Unilateral W/Chest Right Result Date: 10/14/2023 CLINICAL DATA:  [name] 3 days ago, concern for pneumothorax EXAM: RIGHT RIBS AND CHEST - 3+ VIEW COMPARISON:  02/08/2006 FINDINGS: Frontal view of the chest as well as frontal and oblique views of the right thoracic cage are obtained. The cardiac silhouette is unremarkable. There are displaced right lateral third through eighth rib fractures. There is an associated right-sided pneumothorax, volume estimated 50%. No mediastinal shift or tension effect. Small right effusion. No acute airspace disease. The left chest is clear. IMPRESSION: 1. Right-sided pneumothorax, volume estimated 50%. No tension effect or midline shift. 2. Displaced right lateral third through eighth rib fractures. 3. Trace right effusion. Critical Value/emergent results were called by telephone at the time of interpretation on 10/14/2023 at 3:15 pm to provider [facility], who verbally acknowledged these results. Electronically Signed   By: Ozell Daring M.D.   On: 10/14/2023 15:21      IMPRESSION AND PLAN:  Assessment and Plan: * Closed traumatic fracture of ribs of right side with pneumothorax - The patient will be admitted to a progressive unit bed. - He is status post right chest tube placement. - Pain management will be provided. - Pulmonary consult to be obtained for management of his chest tube. -  Dr. Aleskerov was notified and is aware about the patient.  Leukocytosis - This is likely due to stress demargination. - We will follow CBC.  Dehydration - He will  be hydrated with IV normal saline. - Will hold off diuretic therapy.  Essential hypertension - Will continue antihypertensive therapy.   DVT prophylaxis: Lovenox .  Advanced Care Planning:  Code Status: full code.  Family Communication:  The plan of care was discussed in details with the patient (and family). I answered all questions. The patient agreed to proceed with the above mentioned plan. Further management will depend upon hospital course. Disposition Plan: Back to previous home environment Consults called: Pulmonolology All the records are reviewed and case discussed with ED provider.  Status is: Inpatient  At the time of the admission, it appears that the appropriate admission status for this patient is inpatient.  This is judged to be reasonable and necessary in order to provide the required intensity of service to ensure the patient's safety given the presenting symptoms, physical exam findings and initial radiographic and laboratory data in the context of comorbid conditions.  The patient requires inpatient status due to high intensity of service, high risk of further deterioration and high frequency of surveillance required.  I certify that at the time of admission, it is my clinical judgment that the patient will require inpatient hospital care extending more than 2 midnights.                            Dispo: The patient is from: Home              Anticipated d/c is to: Home              Patient currently is not medically stable to d/c.              Difficult to place patient: No  [place] DELENA Peaches M.D on 10/14/2023 at 11:13 PM  [facility]   From 7 PM-7 AM, contact night-coverage [email or web address]  CC: Primary care physician; Lelon Norleen ORN., MD

## 2023-10-14 NOTE — Assessment & Plan Note (Signed)
-   Will continue antihypertensive therapy.

## 2023-10-14 NOTE — ED Notes (Signed)
EDP at bedside for chest tube insertion.

## 2023-10-14 NOTE — ED Triage Notes (Signed)
 Pt to ED with daughter in law from [facility] for injuries from mechanical fall 3 days ago (was prying wood off the deck and fell backwards) about 5' onto his back. Had xray at [facility] in [place]: 3 broken ribs and probable small pneumothorax.  Painful to take deep breath but no SOB. Had tramadol and steroid shot PTA.  Pt in NAD.

## 2023-10-14 NOTE — Assessment & Plan Note (Signed)
-   This is likely due to stress demargination. - We will follow CBC.

## 2023-10-14 NOTE — Assessment & Plan Note (Signed)
-   He will be hydrated with IV normal saline. - Will hold off diuretic therapy.

## 2023-10-14 NOTE — ED Provider Notes (Signed)
 [facility] Provider Note    Event Date/Time   First MD Initiated Contact with Patient 10/14/23 1624     (approximate)   History   Chief Complaint: Rib Injury, Fall, and Chest Injury   HPI  Logan Carpenter is a 73 y.o. male with a history of hypertension who was in his usual state of health until 4 days ago when he fell off his deck accidentally, landing onto his right side and back.  Has had pain since then, denies shortness of breath or fever.  Since symptoms were persistent he went to urgent care today where tramadol relieved his pain, x-ray was concerning for rib fractures and so he was sent to the ED for further evaluation.  Not taking blood thinners.  Currently denies pain.        Past Medical History:  Diagnosis Date   Hx of adenomatous colonic polyps 02/03/2020   Hypertension     Current Outpatient Rx   Order #: [id number] Class: Historical Med   Order #: [id number] Class: Historical Med   Order #: [id number] Class: Historical Med   Order #: [id number] Class: Historical Med   Order #: [id number] Class: Historical Med    Past Surgical History:  Procedure Laterality Date   COLONOSCOPY     neck surgery     broke neck- has screw and bone graft    TONSILLECTOMY     age 22     Physical Exam   Triage Vital Signs: ED Triage Vitals  Encounter Vitals Group     BP 10/14/23 1405 (!) 160/77     Girls Systolic BP Percentile --      Girls Diastolic BP Percentile --      Boys Systolic BP Percentile --      Boys Diastolic BP Percentile --      Pulse Rate 10/14/23 1405 73     Resp 10/14/23 1405 20     Temp 10/14/23 1405 97.9 F (36.6 C)     Temp Source 10/14/23 1405 Oral     SpO2 10/14/23 1405 98 %     Weight 10/14/23 1408 135 lb (61.2 kg)     Height 10/14/23 1408 5' 9 (1.753 m)     Head Circumference --      Peak Flow --      Pain Score 10/14/23 1406 1     Pain Loc --      Pain Education --      Exclude from Growth Chart --     Most recent  vital signs: Vitals:   10/14/23 1405  BP: (!) 160/77  Pulse: 73  Resp: 20  Temp: 97.9 F (36.6 C)  SpO2: 98%    General: Awake, no distress.  CV:  Good peripheral perfusion.  Regular rate rhythm Resp:  Normal effort.  Absent breath sounds in the right lateral lung fields Abd:  No distention.  Soft nontender Other:  Moving all extremities, no lacerations, head atraumatic.  No midline spinal tenderness.  There is tenderness along the posterior thorax.  There is right sided back pain with movement.   ED Results / Procedures / Treatments   Labs (all labs ordered are listed, but only abnormal results are displayed) Labs Reviewed  CBC WITH DIFFERENTIAL/PLATELET - Abnormal; Notable for the following components:      Result Value   WBC 15.1 (*)    Neutro Abs 14.2 (*)    Lymphs Abs 0.5 (*)    Abs Immature Granulocytes 0.08 (*)  All other components within normal limits  BASIC METABOLIC PANEL WITH GFR - Abnormal; Notable for the following components:   Glucose, Bld 129 (*)    BUN 29 (*)    All other components within normal limits     EKG    RADIOLOGY Chest x-ray interpreted by me, shows multiple rib fractures with pneumothorax.  Radiology report reviewed.  Findings discussed with radiologist   PROCEDURES:  CHEST TUBE INSERTION  Date/Time: 10/14/2023 8:24 PM  Performed by: Viviann Pastor, MD Authorized by: Viviann Pastor, MD   Consent:    Consent obtained:  Written   Consent given by:  Patient   Risks discussed:  Infection, bleeding, pain, nerve damage and damage to surrounding structures   Alternatives discussed:  No treatment Universal protocol:    Patient identity confirmed:  Verbally with patient and arm band Pre-procedure details:    Skin preparation:  Alcohol and chlorhexidine   Preparation: Patient was prepped and draped in the usual sterile fashion   Sedation:    Sedation type:  None Anesthesia:    Anesthesia method:  Local infiltration   Local  anesthetic:  Lidocaine  1% WITH epi Procedure details:    Placement location:  R lateral   Scalpel size:  11   Tube size (Fr):  20   Dissection instrument:  Kelly clamp   Ultrasound guidance: no     Tension pneumothorax: no     Tube connected to:  Suction   Drainage characteristics:  Bloody   Suture material:  2-0 silk   Dressing:  Xeroform gauze and 4x4 sterile gauze Post-procedure details:    Post-insertion x-ray findings: tube in good position     Procedure completion:  Tolerated well, no immediate complications    MEDICATIONS ORDERED IN ED: Medications - No data to display   IMPRESSION / MDM / ASSESSMENT AND PLAN / ED COURSE  I reviewed the triage vital signs and the nursing notes.  DDx: Pneumothorax, hemothorax, rib fractures, pulmonary contusion  Patient's presentation is most consistent with acute presentation with potential threat to life or bodily function.     Clinical Course as of 10/14/23 2024  Tue Oct 14, 2023  1647 Patient presents with right-sided chest pain after a fall 4 days ago.  Found to have 6 rib fractures and 50% pneumothorax.  On my review of the x-ray he may in effusion, possible hemothorax will obtain CT [PS]  2009 Chest tube inserted unevenfully. 20Fr. blood return initially followed by air evacuation. [PS]  2018 Case d/w hospitalist [PS]    Clinical Course User Index [PS] Viviann Pastor, MD     FINAL CLINICAL IMPRESSION(S) / ED DIAGNOSES   Final diagnoses:  None     Rx / DC Orders   ED Discharge Orders     None        Note:  This document was prepared using Dragon voice recognition software and may include unintentional dictation errors.   Viviann Pastor, MD 10/14/23 2025

## 2023-10-14 NOTE — Assessment & Plan Note (Signed)
-   The patient will be admitted to a progressive unit bed. - He is status post right chest tube placement. - Pain management will be provided. - Pulmonary consult to be obtained for management of his chest tube. - Dr. Aleskerov was notified and is aware about the patient.

## 2023-10-14 NOTE — ED Notes (Signed)
 Pt denies any pain. Provided with warm blanket, call bell in reach and urinal.

## 2023-10-15 DIAGNOSIS — S2241XA Multiple fractures of ribs, right side, initial encounter for closed fracture: Secondary | ICD-10-CM | POA: Diagnosis not present

## 2023-10-15 DIAGNOSIS — S270XXA Traumatic pneumothorax, initial encounter: Secondary | ICD-10-CM | POA: Diagnosis not present

## 2023-10-15 DIAGNOSIS — J939 Pneumothorax, unspecified: Secondary | ICD-10-CM | POA: Diagnosis present

## 2023-10-15 LAB — BASIC METABOLIC PANEL WITH GFR
Anion gap: 8 (ref 5–15)
BUN: 24 mg/dL — ABNORMAL HIGH (ref 8–23)
CO2: 28 mmol/L (ref 22–32)
Calcium: 9.3 mg/dL (ref 8.9–10.3)
Chloride: 105 mmol/L (ref 98–111)
Creatinine, Ser: 0.75 mg/dL (ref 0.61–1.24)
GFR, Estimated: >60 mL/min (ref >60)
Glucose, Bld: 103 mg/dL — ABNORMAL HIGH (ref 70–99)
Potassium: 4.1 mmol/L (ref 3.5–5.1)
Sodium: 141 mmol/L (ref 135–145)

## 2023-10-15 LAB — CBC
HCT: 39.4 % (ref 39.0–52.0)
Hemoglobin: 13.4 g/dL (ref 13.0–17.0)
MCH: 31.9 pg (ref 26.0–34.0)
MCHC: 34 g/dL (ref 30.0–36.0)
MCV: 93.8 fL (ref 80.0–100.0)
Platelets: 151 K/uL (ref 150–400)
RBC: 4.2 MIL/uL — ABNORMAL LOW (ref 4.22–5.81)
RDW: 13 % (ref 11.5–15.5)
WBC: 15.4 K/uL — ABNORMAL HIGH (ref 4.0–10.5)
nRBC: 0 % (ref 0.0–0.2)

## 2023-10-15 LAB — VITAMIN D 25 HYDROXY (VIT D DEFICIENCY, FRACTURES): Vit D, 25-Hydroxy: 41.22 ng/mL (ref 30–100)

## 2023-10-15 LAB — PHOSPHORUS: Phosphorus: 3.4 mg/dL (ref 2.5–4.6)

## 2023-10-15 LAB — PROCALCITONIN: Procalcitonin: <0.1 ng/mL

## 2023-10-15 LAB — VITAMIN B12: Vitamin B-12: 645 pg/mL (ref 180–914)

## 2023-10-15 LAB — MAGNESIUM: Magnesium: 2 mg/dL (ref 1.7–2.4)

## 2023-10-15 MED ORDER — IPRATROPIUM-ALBUTEROL 0.5-2.5 (3) MG/3ML IN SOLN
3.0000 mL | Freq: Four times a day (QID) | RESPIRATORY_TRACT | Status: DC
  Administered 2023-10-15: 3 mL via RESPIRATORY_TRACT
  Filled 2023-10-15: qty 3

## 2023-10-15 MED ORDER — IPRATROPIUM-ALBUTEROL 0.5-2.5 (3) MG/3ML IN SOLN: 3.0000 mL | Freq: Four times a day (QID) | RESPIRATORY_TRACT | Status: DC | PRN

## 2023-10-15 MED ORDER — LISINOPRIL 2.5 MG PO TABS
2.5000 mg | ORAL_TABLET | Freq: Every day | ORAL | Status: DC
Start: 2023-10-16 — End: 2023-10-16
  Administered 2023-10-16: 2.5 mg via ORAL
  Filled 2023-10-15: qty 1

## 2023-10-15 MED ORDER — FUROSEMIDE 10 MG/ML IJ SOLN
40.0000 mg | Freq: Every day | INTRAMUSCULAR | Status: DC
  Administered 2023-10-15 – 2023-10-16 (×2): 40 mg via INTRAVENOUS
  Filled 2023-10-15 (×2): qty 4

## 2023-10-15 NOTE — Progress Notes (Signed)
 [facility] Progress Note  Patient: Logan Carpenter    FMW:[id number]  DOA: 10/14/2023     Date of Service: the patient was seen and examined on 10/15/2023  Chief Complaint  Patient presents with   Rib Injury   Fall   Chest Injury   Brief hospital course: Logan Carpenter is a 73 y.o. male with medical history significant for essential hypertension, who presented to the emergency room with persistent right-sided chest pain mainly with movement and postural changes without significant dyspnea or cough or wheezing.  No fever or chills.  No nausea or vomiting or abdominal pain.  He had a mechanical fall on Friday when he landed on his right side.  He wanted to wait a few days to see if pain will resolve.  He denied any paresthesias or focal muscle weakness.  No syncope or syncope.  No dysuria, oliguria or hematuria or flank pain.  No other bleeding diathesis.   ED Course: When he came to the ER, BP was 160/77 with otherwise normal vital signs.  Labs revealed BUN of 29 and otherwise normal BMP.  CBC showed leukocytosis 15.1 with neutrophilia EKG as reviewed by me : None Imaging: Chest and right rib x-ray: Right-sided pneumothorax, volume estimated 50%. No tension effect or midline shift. Displaced right lateral third through eighth rib fractures. Trace right effusion.   Assessment and Plan:  # Closed traumatic fracture of ribs of right side with pneumothorax - s/p right chest tube placement. - continue Pain management  - Pulmonary consulted Check orthostatics   # Leukocytosis - This is likely due to stress demargination. - We will follow CBC. Check procalcitonin  # Dehydration - s/p hydrated with IV normal saline. - Will hold off diuretic therapy.   Essential hypertension - Continue lisinopril  Monitor BP and titrate medications according    Body mass index is 19.94 kg/m.  Interventions:  Diet: Heart healthy diet DVT Prophylaxis: Subcutaneous Lovenox    Advance goals of  care discussion: DNR-intervene  Family Communication: family was not present at bedside, at the time of interview.  The pt provided permission to discuss medical plan with the family. Opportunity was given to ask question and all questions were answered satisfactorily.   Disposition:  Pt is from home, admitted with right pneumothorax, still has right chest tube, which precludes a safe discharge. Discharge to home, when stable and cleared by pulmonary.  Subjective: No significant events overnight, patient denies any pain at rest but it does hurt on movement, change with posture.  No any other complaints  Physical Exam: General: NAD, lying comfortably Appear in no distress, affect appropriate Eyes: PERRLA ENT: Oral Mucosa Clear, moist  Neck: no JVD,  Cardiovascular: S1 and S2 Present, no Murmur,  Respiratory: good respiratory effort, Bilateral Air entry equal and Decreased, no Crackles, no wheezes, right chest tube intact Abdomen: Bowel Sound present, Soft and no tenderness,  Skin: no rashes Extremities: no Pedal edema, no calf tenderness Neurologic: without any new focal findings Gait not checked due to patient safety concerns  Vitals:   10/15/23 0934 10/15/23 1000 10/15/23 1030 10/15/23 1333  BP:  (!) 143/65 (!) 146/73 (!) 146/72  Pulse:  66 68 68  Resp:    18  Temp: 98.3 F (36.8 C)   98.5 F (36.9 C)  TempSrc: Oral   Oral  SpO2:  100% 100% 100%  Weight:      Height:        Intake/Output Summary (Last 24 hours) at  10/15/2023 1447 Last data filed at 10/15/2023 9170 Gross per 24 hour  Intake --  Output 300 ml  Net -300 ml   Filed Weights   10/14/23 1408  Weight: 61.2 kg    Data Reviewed: I have personally reviewed and interpreted daily labs, tele strips, imagings as discussed above. I reviewed all nursing notes, pharmacy notes, vitals, pertinent old records I have discussed plan of care as described above with RN and patient/family.  CBC: Recent Labs  Lab  10/14/23 1410 10/15/23 0536  WBC 15.1* 15.4*  NEUTROABS 14.2*  --   HGB 15.0 13.4  HCT 43.8 39.4  MCV 94.6 93.8  PLT 167 151   Basic Metabolic Panel: Recent Labs  Lab 10/14/23 1410 10/15/23 0536  NA 139 141  K 4.2 4.1  CL 102 105  CO2 28 28  GLUCOSE 129* 103*  BUN 29* 24*  CREATININE 0.89 0.75  CALCIUM 9.7 9.3  MG  --  2.0  PHOS  --  3.4    Studies: DG Chest Portable 1 View Result Date: 10/14/2023 CLINICAL DATA:  Chest tube pneumothorax EXAM: PORTABLE CHEST 1 VIEW COMPARISON:  10/14/2023 FINDINGS: Interim placement of right-sided chest tube with tip over the right lower chest. Decreased right pneumothorax apical laterally. Suspect residual pneumothorax at the right base/CP angle as evidence by relative hyperlucency. Normal cardiac size. Aortic atherosclerosis. Multiple right-sided rib fractures IMPRESSION: Interim placement of right-sided chest tube with decreased right pneumothorax. Suspect small residual pneumothorax at the right base/CP angle. Electronically Signed   By: Luke Bun M.D.   On: 10/14/2023 20:37   CT Chest W Contrast Result Date: 10/14/2023 CLINICAL DATA:  [name] 3 days ago, pneumothorax and right rib fracture seen on earlier x-ray EXAM: CT CHEST WITH CONTRAST TECHNIQUE: Multidetector CT imaging of the chest was performed during intravenous contrast administration. RADIATION DOSE REDUCTION: This exam was performed according to the departmental dose-optimization program which includes automated exposure control, adjustment of the mA and/or kV according to patient size and/or use of iterative reconstruction technique. CONTRAST:  75mL OMNIPAQUE  IOHEXOL  300 MG/ML  SOLN COMPARISON:  9 in 925 FINDINGS: Cardiovascular: The heart is unremarkable without pericardial effusion. No evidence of thoracic aortic aneurysm or dissection. No evidence of vascular injury. Atherosclerosis of the aorta and coronary vasculature. Mediastinum/Nodes: No enlarged mediastinal, hilar, or axillary  lymph nodes. Thyroid gland, trachea, and esophagus demonstrate no significant findings. Lungs/Pleura: Right-sided pneumothorax is again identified, volume estimated 25%. No tension effect or midline shift. Trace free-flowing right pleural effusion. Dependent atelectasis within the right lower lobe. Multiple pulmonary nodules as follows: Right middle lobe, subpleural, image 108/3, 12 x 10 x 4 mm. Right middle lobe, subpleural, image 126/3, 6 x 3 x 5 mm. Right upper lobe, image 89/3, 11 x 11 x 8 mm. The left lung is clear, with no left-sided effusion or left pneumothorax. The central airways are patent. Upper Abdomen: No acute abnormality. Thickening of the bilateral adrenal glands, measuring 1.5 cm on the right with attenuation of 37 HU and 3.3 cm on the left with attenuation of 32 HU. Musculoskeletal: There are minimally displaced right anterior second through fifth rib fractures. There are displaced right lateral fourth through eighth rib fractures. Fractures are also seen through the right posterior second through eighth ribs at the costovertebral junctions. No left-sided rib fractures. Mild diffuse cervical and thoracic spondylosis. Reconstructed images demonstrate no additional findings. IMPRESSION: 1. Right-sided pneumothorax, volume estimated 25%. No tension effect or midline shift. 2. Trace right free-flowing  pleural effusion. 3. Numerous right-sided rib fractures as above. 4. Multiple pulmonary nodules. Most significant: Right upper lobe solid pulmonary nodule measuring 10 mm. Per Fleischner Society Guidelines, consider a non-contrast Chest CT at 3 months, a PET/CT, or tissue sampling. These guidelines do not apply to immunocompromised patients and patients with cancer. Follow up in patients with significant comorbidities as clinically warranted. For lung cancer screening, adhere to Lung-RADS guidelines. Reference: Radiology. 2017; 284(1):228-43. 5. Aortic Atherosclerosis (ICD10-I70.0). Coronary artery  atherosclerosis. 6. Nonspecific low-attenuation bilateral adrenal gland thickening, most likely representing adrenal hyperplasia. Electronically Signed   By: Ozell Daring M.D.   On: 10/14/2023 18:18    Scheduled Meds:  B-complex with vitamin C  1 tablet Oral Daily   enoxaparin  (LOVENOX ) injection  40 mg Subcutaneous Q24H   hydrochlorothiazide   25 mg Oral Daily   lisinopril   5 mg Oral Daily   multivitamin with minerals  1 tablet Oral Daily   Continuous Infusions:  sodium chloride  100 mL/hr at 10/15/23 0740   PRN Meds: acetaminophen  **OR** acetaminophen , magnesium  hydroxide, morphine  injection, ondansetron  **OR** ondansetron  (ZOFRAN ) IV, oxyCODONE -acetaminophen , traZODone   Time spent: 35 minutes  Author: ELVAN SOR. MD [facility] 10/15/2023 2:47 PM  To reach On-call, see care teams to locate the attending and reach out to them via [email or web address]. If 7PM-7AM, please contact night-coverage If you still have difficulty reaching the attending provider, please page the [facility] (Director on Call) for [facility] on amion for assistance.

## 2023-10-15 NOTE — Plan of Care (Signed)

## 2023-10-15 NOTE — Consult Note (Signed)
 PULMONOLOGY         Date: 10/15/2023,   MRN# [id number] Logan Carpenter 1950-11-23     AdmissionWeight: 61.2 kg                 CurrentWeight: 61.2 kg  Referring provider: Dr Von   CHIEF COMPLAINT:   Traumatic Pneumothorax    HISTORY OF PRESENT ILLNESS   73 yo male with medical history significant for HTN who came in with R chest pain x 4 days. Did not have fevers or flu like illness or respiratory distress.   No fever or chills.  No nausea or vomiting or abdominal pain.  He reports falls 1 wk prior to admission.   No syncope or syncope.  No dysuria, oliguria or hematuria or flank pain.  No other bleeding diathesis. The patient had CXR with findings concerning for pneumothroax.  He had CT chest with confirmation of pneumothorax and Right 5-8th rib fractures.  He also has multiple lung nodules including in RUL and RML.      PAST MEDICAL HISTORY   Past Medical History:  Diagnosis Date   Hx of adenomatous colonic polyps 02/03/2020   Hypertension      SURGICAL HISTORY   Past Surgical History:  Procedure Laterality Date   COLONOSCOPY     neck surgery     broke neck- has screw and bone graft    TONSILLECTOMY     age 24      FAMILY HISTORY   Family History  Problem Relation Age of Onset   Colon cancer Neg Hx    Colon polyps Neg Hx    Esophageal cancer Neg Hx    Rectal cancer Neg Hx    Stomach cancer Neg Hx      SOCIAL HISTORY   Social History   Tobacco Use   Smoking status: Some Days    Types: Cigars   Smokeless tobacco: Former  Building services engineer status: Never Used  Substance Use Topics   Alcohol use: Yes    Comment: social    Drug use: Never     MEDICATIONS    Home Medication:  Current Outpatient Rx   Order #: [id number] Class: Historical Med   Order #: [id number] Class: Historical Med   Order #: [id number] Class: Historical Med   Order #: [id number] Class: Historical Med   Order #: [id number] Class: Historical Med    Current  Medication:  Current Facility-Administered Medications:    0.9 %  sodium chloride  infusion, , Intravenous, Continuous, Mansy, Jan A, MD, Last Rate: 100 mL/hr at 10/15/23 0740, Rate Verify at 10/15/23 0740   acetaminophen  (TYLENOL ) tablet 650 mg, 650 mg, Oral, Q6H PRN **OR** acetaminophen  (TYLENOL ) suppository 650 mg, 650 mg, Rectal, Q6H PRN, Mansy, Jan A, MD   B-complex with vitamin C tablet 1 tablet, 1 tablet, Oral, Daily, Lenon Elsie HERO, [facility], 1 tablet at 10/15/23 9093   enoxaparin  (LOVENOX ) injection 40 mg, 40 mg, Subcutaneous, Q24H, Mansy, Jan A, MD, 40 mg at 10/14/23 2238   hydrochlorothiazide  (HYDRODIURIL ) tablet 25 mg, 25 mg, Oral, Daily, Mansy, Jan A, MD, 25 mg at 10/15/23 0907   lisinopril  (ZESTRIL ) tablet 5 mg, 5 mg, Oral, Daily, Mansy, Jan A, MD, 5 mg at 10/15/23 9093   magnesium  hydroxide (MILK OF MAGNESIA) suspension 30 mL, 30 mL, Oral, Daily PRN, Mansy, Jan A, MD   morphine  (PF) 2 MG/ML injection 2 mg, 2 mg, Intravenous, Q4H PRN, Mansy, Madison LABOR, MD   multivitamin with minerals  tablet 1 tablet, 1 tablet, Oral, Daily, Mansy, Jan A, MD, 1 tablet at 10/15/23 9093   ondansetron  (ZOFRAN ) tablet 4 mg, 4 mg, Oral, Q6H PRN **OR** ondansetron  (ZOFRAN ) injection 4 mg, 4 mg, Intravenous, Q6H PRN, Mansy, Jan A, MD   oxyCODONE -acetaminophen  (PERCOCET/ROXICET) 5-325 MG per tablet 1-2 tablet, 1-2 tablet, Oral, Q4H PRN, Mansy, Jan A, MD   traZODone  (DESYREL ) tablet 25 mg, 25 mg, Oral, QHS PRN, Mansy, Jan A, MD, 25 mg at 10/15/23 0133  Current Outpatient Medications:    b complex vitamins capsule, Take 1 capsule by mouth daily., Disp: , Rfl:    hydrochlorothiazide  (HYDRODIURIL ) 25 MG tablet, Take 25 mg by mouth daily., Disp: , Rfl:    lisinopril  (ZESTRIL ) 40 MG tablet, , Disp: , Rfl:    Melatonin 10 MG CAPS, Take by mouth., Disp: , Rfl:    Multiple Vitamin (MULTIVITAMIN) tablet, Take 1 tablet by mouth daily., Disp: , Rfl:     ALLERGIES   Patient has no known allergies.     REVIEW OF  SYSTEMS    Review of Systems:  Gen:  Denies  fever, sweats, chills weigh loss  HEENT: Denies blurred vision, double vision, ear pain, eye pain, hearing loss, nose bleeds, sore throat Cardiac:  No dizziness, chest pain or heaviness, chest tightness,edema Resp:   reports dyspnea chronically  Gi: Denies swallowing difficulty, stomach pain, nausea or vomiting, diarrhea, constipation, bowel incontinence Gu:  Denies bladder incontinence, burning urine Ext:   Denies Joint pain, stiffness or swelling Skin: Denies  skin rash, easy bruising or bleeding or hives Endoc:  Denies polyuria, polydipsia , polyphagia or weight change Psych:   Denies depression, insomnia or hallucinations   Other:  All other systems negative   VS: BP (!) 146/72   Pulse 68   Temp 98.5 F (36.9 C) (Oral)   Resp 18   Ht 5' 9 (1.753 m)   Wt 61.2 kg   SpO2 100%   BMI 19.94 kg/m      PHYSICAL EXAM    GENERAL:NAD, no fevers, chills, no weakness no fatigue HEAD: Normocephalic, atraumatic.  EYES: Pupils equal, round, reactive to light. Extraocular muscles intact. No scleral icterus.  MOUTH: Moist mucosal membrane. Dentition intact. No abscess noted.  EAR, NOSE, THROAT: Clear without exudates. No external lesions.  NECK: Supple. No thyromegaly. No nodules. No JVD.  PULMONARY: decreased breath sounds with mild rhonchi worse at bases bilaterally.  CARDIOVASCULAR: S1 and S2. Regular rate and rhythm. No murmurs, rubs, or gallops. No edema. Pedal pulses 2+ bilaterally.  GASTROINTESTINAL: Soft, nontender, nondistended. No masses. Positive bowel sounds. No hepatosplenomegaly.  MUSCULOSKELETAL: No swelling, clubbing, or edema. Range of motion full in all extremities.  NEUROLOGIC: Cranial nerves II through XII are intact. No gross focal neurological deficits. Sensation intact. Reflexes intact.  SKIN: No ulceration, lesions, rashes, or cyanosis. Skin warm and dry. Turgor intact.  PSYCHIATRIC: Mood, affect within normal  limits. The patient is awake, alert and oriented x 3. Insight, judgment intact.       IMAGING   @IMAGES @ arrative & Impression CLINICAL DATA:  [name] 3 days ago, pneumothorax and right rib fracture seen on earlier x-ray   EXAM: CT CHEST WITH CONTRAST   TECHNIQUE: Multidetector CT imaging of the chest was performed during intravenous contrast administration.   RADIATION DOSE REDUCTION: This exam was performed according to the departmental dose-optimization program which includes automated exposure control, adjustment of the mA and/or kV according to patient size and/or use of iterative reconstruction  technique.   CONTRAST:  75mL OMNIPAQUE  IOHEXOL  300 MG/ML  SOLN   COMPARISON:  9 in 925   FINDINGS: Cardiovascular: The heart is unremarkable without pericardial effusion. No evidence of thoracic aortic aneurysm or dissection. No evidence of vascular injury. Atherosclerosis of the aorta and coronary vasculature.   Mediastinum/Nodes: No enlarged mediastinal, hilar, or axillary lymph nodes. Thyroid gland, trachea, and esophagus demonstrate no significant findings.   Lungs/Pleura: Right-sided pneumothorax is again identified, volume estimated 25%. No tension effect or midline shift. Trace free-flowing right pleural effusion. Dependent atelectasis within the right lower lobe. Multiple pulmonary nodules as follows:   Right middle lobe, subpleural, image 108/3, 12 x 10 x 4 mm.   Right middle lobe, subpleural, image 126/3, 6 x 3 x 5 mm.   Right upper lobe, image 89/3, 11 x 11 x 8 mm.   The left lung is clear, with no left-sided effusion or left pneumothorax. The central airways are patent.   Upper Abdomen: No acute abnormality. Thickening of the bilateral adrenal glands, measuring 1.5 cm on the right with attenuation of 37 HU and 3.3 cm on the left with attenuation of 32 HU.   Musculoskeletal: There are minimally displaced right anterior second through fifth rib fractures.  There are displaced right lateral fourth through eighth rib fractures. Fractures are also seen through the right posterior second through eighth ribs at the costovertebral junctions. No left-sided rib fractures. Mild diffuse cervical and thoracic spondylosis. Reconstructed images demonstrate no additional findings.   IMPRESSION: 1. Right-sided pneumothorax, volume estimated 25%. No tension effect or midline shift. 2. Trace right free-flowing pleural effusion. 3. Numerous right-sided rib fractures as above. 4. Multiple pulmonary nodules. Most significant: Right upper lobe solid pulmonary nodule measuring 10 mm. Per Fleischner Society Guidelines, consider a non-contrast Chest CT at 3 months, a PET/CT, or tissue sampling. These guidelines do not apply to immunocompromised patients and patients with cancer. Follow up in patients with significant comorbidities as clinically warranted. For lung cancer screening, adhere to Lung-RADS guidelines. Reference: Radiology. 2017; 284(1):228-43. 5. Aortic Atherosclerosis (ICD10-I70.0). Coronary artery atherosclerosis. 6. Nonspecific low-attenuation bilateral adrenal gland thickening, most likely representing adrenal hyperplasia.     Electronically Signed   By: Ozell Daring M.D.   On: 10/14/2023 18:18  ASSESSMENT/PLAN   Traumatic right pneumothorax                 - s/p chest tube insertion - +air leak with forced expiratory maneuver 2. Right sided rib fractures - supportive care only , [place] 3. Right upper and middle lobe lung nodules - will likely need biopsy on outpatient 4. Right pleural effusion - not big enough to aspirate recommendation for diuresis, currently patient is getting IVF at 75/hr.  I have stopped this and will add lasix  IV.  I have reduced lisinopril  to 2.5mg  to avoid transient hypotension while diuresing. 5. Centrilobular emphysema - Duoneb q6h PRN 6.GI/dvt ppx - protonix and lovenox         Thank you for allowing  me to participate in the care of this patient.   Patient/Family are satisfied with care plan and all questions have been answered.    Provider disclosure: Patient with at least one acute or chronic illness or injury that poses a threat to life or bodily function and is being managed actively during this encounter.  All of the below services have been performed independently by signing provider:  review of prior documentation from internal and or external health records.  Review of  previous and current lab results.  Interview and comprehensive assessment during patient visit today. Review of current and previous chest radiographs/CT scans. Discussion of management and test interpretation with health care team and patient/family.   This document was prepared using Dragon voice recognition software and may include unintentional dictation errors.     Ozetta Flatley, M.D.  Division of Pulmonary & Critical Care Medicine

## 2023-10-16 ENCOUNTER — Inpatient Hospital Stay

## 2023-10-16 DIAGNOSIS — S270XXA Traumatic pneumothorax, initial encounter: Secondary | ICD-10-CM | POA: Diagnosis not present

## 2023-10-16 DIAGNOSIS — S2241XA Multiple fractures of ribs, right side, initial encounter for closed fracture: Secondary | ICD-10-CM | POA: Diagnosis not present

## 2023-10-16 LAB — BASIC METABOLIC PANEL WITH GFR
Anion gap: 10 (ref 5–15)
BUN: 29 mg/dL — ABNORMAL HIGH (ref 8–23)
CO2: 29 mmol/L (ref 22–32)
Calcium: 9.1 mg/dL (ref 8.9–10.3)
Chloride: 102 mmol/L (ref 98–111)
Creatinine, Ser: 0.91 mg/dL (ref 0.61–1.24)
GFR, Estimated: >60 mL/min (ref >60)
Glucose, Bld: 90 mg/dL (ref 70–99)
Potassium: 3.7 mmol/L (ref 3.5–5.1)
Sodium: 141 mmol/L (ref 135–145)

## 2023-10-16 LAB — CBC
HCT: 39.6 % (ref 39.0–52.0)
Hemoglobin: 13.6 g/dL (ref 13.0–17.0)
MCH: 32 pg (ref 26.0–34.0)
MCHC: 34.3 g/dL (ref 30.0–36.0)
MCV: 93.2 fL (ref 80.0–100.0)
Platelets: 148 K/uL — ABNORMAL LOW (ref 150–400)
RBC: 4.25 MIL/uL (ref 4.22–5.81)
RDW: 13.2 % (ref 11.5–15.5)
WBC: 10.7 K/uL — ABNORMAL HIGH (ref 4.0–10.5)
nRBC: 0 % (ref 0.0–0.2)

## 2023-10-16 LAB — PHOSPHORUS: Phosphorus: 3.9 mg/dL (ref 2.5–4.6)

## 2023-10-16 LAB — MAGNESIUM: Magnesium: 2.1 mg/dL (ref 1.7–2.4)

## 2023-10-16 MED ORDER — ACETAMINOPHEN 325 MG PO TABS: 650.0000 mg | ORAL_TABLET | Freq: Four times a day (QID) | ORAL | Status: AC | PRN

## 2023-10-16 NOTE — TOC CM/SW Note (Signed)
 Transition of Care [facility]) - Inpatient Brief Assessment   Patient Details  Name: Logan Carpenter MRN: [id number] Date of Birth: November 13, 1950  Transition of Care [facility]) CM/SW Contact:    Alfonso Rummer, LCSW Phone Number: 10/16/2023, 1:41 PM   Clinical Narrative: LCSW A. Kayana Thoen spk with pt in room 205. LCSW A. Meria Crilly advised pt of recommendations for home health with physical therapy. Logan Carpenter reports he does not want home health services. According to Logan Carpenter, his two adult son and grandchildren lives on the property are is very supportive. [name] his understanding of home health with physical therapy and request printed information of home health agency accepting his insurance. Logan Carpenter reports should a need arise after discharge he will set up home health.   Transition of Care Asessment: Insurance and Status: Insurance coverage has been reviewed Patient has primary care physician: Yes (Dr Norleen Ferrier) Home environment has been reviewed: Pt reports he lives in a single family home [place]   Prior/Current Home Services: No current home services Social Drivers of Health Review: SDOH reviewed no interventions necessary Readmission risk has been reviewed: Yes Transition of care needs: no transition of care needs at this time

## 2023-10-16 NOTE — Discharge Summary (Signed)
 [facility] Discharge Summary   Patient: Logan Carpenter FMW:[id number]  PCP: Lelon Norleen ORN., MD  Date of admission: 10/14/2023   Date of discharge:  10/16/2023     Discharge Diagnoses:  Principal Problem:   Closed traumatic fracture of ribs of right side with pneumothorax Active Problems:   Leukocytosis   Dehydration   Essential hypertension   Pneumothorax on right   Admitted From: Home Disposition:  Home   Recommendations for Outpatient Follow-up:  Follow-up with PCP in 1 week Follow-up with pulmonary in 5 days and repeat chest x-ray as an outpatient. Follow up LABS/TEST:  CXR   Follow-up Information     Lelon Norleen ORN., MD Follow up in 1 week(s).   Specialty: Family Medicine Contact information: [address] [place] [address] [phone]         Parris Manna, MD Follow up in 1 week(s).   Specialty: Pulmonary Disease Contact information: [address] [place] [address] [phone]                Diet recommendation: Cardiac diet  Activity: The patient is advised to gradually reintroduce usual activities, as tolerated  Discharge Condition: stable  Code Status: DNR-intervene  History of present illness: As per the H and P dictated on admission. Hospital Course:  Logan Carpenter is a 73 y.o. male with medical history significant for essential hypertension, who presented to the emergency room with persistent right-sided chest pain mainly with movement and postural changes without significant dyspnea or cough or wheezing.  No fever or chills.  No nausea or vomiting or abdominal pain.  He had a mechanical fall on Friday when he landed on his right side.  He wanted to wait a few days to see if pain will resolve.  He denied any paresthesias or focal muscle weakness.  No syncope or syncope.  No dysuria, oliguria or hematuria or flank pain.  No other bleeding diathesis.   ED Course: When he came to the ER, BP was 160/77 with  otherwise normal vital signs.  Labs revealed BUN of 29 and otherwise normal BMP.  CBC showed leukocytosis 15.1 with neutrophilia EKG as reviewed by me : None Imaging: Chest and right rib x-ray: Right-sided pneumothorax, volume estimated 50%. No tension effect or midline shift. Displaced right lateral third through eighth rib fractures. Trace right effusion.     Assessment and Plan:   # Closed traumatic fracture of ribs of right side with pneumothorax. s/p right chest tube placement.  S/p Pain management. Pulmonary consulted.  CXR shows improvement of pneumothorax.  Chest tube was clamped, repeat chest x-ray shows improvement in the pneumothorax.  Chest tube was removed and patient was cleared by pulmonary to discharge and follow-up in 5 days. Patient agreed with the discharge planning.     # Leukocytosis: This is likely due to stress demargination. Procalcitonin negative, no need of antibiotics. WBC count improving.  Clinically no signs of infection.  # Dehydration: s/p IVF given in the ED.   Patient was given IV Lasix  due to pleural effusion.  Hydration status was well-maintained.    # Essential hypertension: Continue lisinopril  and HCTZ Home dose Monitor BP at home and follow with PCP.   Body mass index is 19.94 kg/m.  Nutrition Interventions:  - Patient was instructed, not to drive, operate heavy machinery, perform activities at heights, swimming or participation in water activities or provide baby sitting services while on Pain, Sleep and Anxiety Medications; until his outpatient  Physician has advised to do so again.  - Also recommended to not to take more than prescribed Pain, Sleep and Anxiety Medications.  Patient was seen by physical therapy, who recommended no therapy needed on discharge. On the day of the discharge the patient's vitals were stable, and no other acute medical condition were reported by patient. the patient was felt safe to be discharge at Home.  Consultants:  Pulmonary Procedures: s/p right chest tube, removed today before discharge  Discharge Exam: General: Appear in no distress, Oral Mucosa Clear, moist. Cardiovascular: S1 and S2 Present, no Murmur, Respiratory: normal respiratory effort, Bilateral Air entry present and no Crackles, no wheezes Abdomen: Bowel Sound present, Soft and no tenderness. Extremities: no Pedal edema, no calf tenderness Neurology: alert and oriented to time, place, and person affect appropriate.  Filed Weights   10/14/23 1408  Weight: 61.2 kg   Vitals:   10/16/23 0301 10/16/23 0833  BP: 136/66 (!) 155/70  Pulse: (!) 56 (!) 50  Resp: 18 14  Temp: 98.2 F (36.8 C) 98.3 F (36.8 C)  SpO2: 100% 100%    DISCHARGE MEDICATION: Allergies as of 10/16/2023   No Known Allergies      Medication List     TAKE these medications    acetaminophen  325 MG tablet Commonly known as: TYLENOL  Take 2 tablets (650 mg total) by mouth every 6 (six) hours as needed for mild pain (pain score 1-3), fever or headache (or Fever >/= 101).   b complex vitamins capsule Take 1 capsule by mouth daily.   hydrochlorothiazide  25 MG tablet Commonly known as: HYDRODIURIL  Take 25 mg by mouth daily.   lisinopril  40 MG tablet Commonly known as: ZESTRIL    Melatonin 10 MG Caps Take by mouth.   multivitamin tablet Take 1 tablet by mouth daily.       No Known Allergies Discharge Instructions     Call MD for:  difficulty breathing, headache or visual disturbances   Complete by: As directed    Call MD for:  extreme fatigue   Complete by: As directed    Call MD for:  persistant dizziness or light-headedness   Complete by: As directed    Call MD for:  persistant nausea and vomiting   Complete by: As directed    Call MD for:  redness, tenderness, or signs of infection (pain, swelling, redness, odor or green/yellow discharge around incision site)   Complete by: As directed    Call MD for:  severe uncontrolled pain   Complete  by: As directed    Call MD for:  temperature >100.4   Complete by: As directed    Diet - low sodium heart healthy   Complete by: As directed    Discharge instructions   Complete by: As directed    Follow-up with PCP in 1 week Follow-up with pulmonary in 5 days and repeat chest x-ray as an outpatient.   Increase activity slowly   Complete by: As directed        The results of significant diagnostics from this hospitalization (including imaging, microbiology, ancillary and laboratory) are listed below for reference.    Significant Diagnostic Studies: DG Chest Port 1 View Result Date: 10/16/2023 EXAM: 1 VIEW XRAY OF THE CHEST 10/16/2023 11:11:00 AM COMPARISON: 10/16/2023 CLINICAL HISTORY: Pneumothorax. Timed pneumothorax comparison image. FINDINGS: LUNGS AND PLEURA: Stable trace right apical pneumothorax. Right chest tube stable in position. HEART AND MEDIASTINUM: Aortic arch calcifications. BONES AND SOFT TISSUES: Right rib fractures. IMPRESSION: 1.  Stable trace right apical pneumothorax. 2. Right chest tube stable in position. 3. Right rib fractures. Electronically signed by: Donnice Mania MD 10/16/2023 11:52 AM EDT RP Workstation: [id number]   DG Chest Port 1 View Result Date: 10/16/2023 CLINICAL DATA:  Pneumothorax, chest tube in place EXAM: PORTABLE CHEST 1 VIEW COMPARISON:  10/14/2023 FINDINGS: Right rib deformities compatible with fractures. Right chest tube in place. Suspected residual pneumothorax best seen along the apex, about 5% of hemithoracic volume. No current blunting of the right costophrenic angle. Atherosclerotic calcification of the aortic arch. Symmetric nipple shadows noted. IMPRESSION: 1. Right chest tube in place with suspected residual pneumothorax, about 5% of hemithoracic volume. 2. Right rib deformities compatible with fractures. 3.  Aortic Atherosclerosis (ICD10-I70.0). Electronically Signed   By: Ryan Salvage M.D.   On: 10/16/2023 08:43   DG Chest Portable 1  View Result Date: 10/14/2023 CLINICAL DATA:  Chest tube pneumothorax EXAM: PORTABLE CHEST 1 VIEW COMPARISON:  10/14/2023 FINDINGS: Interim placement of right-sided chest tube with tip over the right lower chest. Decreased right pneumothorax apical laterally. Suspect residual pneumothorax at the right base/CP angle as evidence by relative hyperlucency. Normal cardiac size. Aortic atherosclerosis. Multiple right-sided rib fractures IMPRESSION: Interim placement of right-sided chest tube with decreased right pneumothorax. Suspect small residual pneumothorax at the right base/CP angle. Electronically Signed   By: Luke Bun M.D.   On: 10/14/2023 20:37   CT Chest W Contrast Result Date: 10/14/2023 CLINICAL DATA:  [name] 3 days ago, pneumothorax and right rib fracture seen on earlier x-ray EXAM: CT CHEST WITH CONTRAST TECHNIQUE: Multidetector CT imaging of the chest was performed during intravenous contrast administration. RADIATION DOSE REDUCTION: This exam was performed according to the departmental dose-optimization program which includes automated exposure control, adjustment of the mA and/or kV according to patient size and/or use of iterative reconstruction technique. CONTRAST:  75mL OMNIPAQUE  IOHEXOL  300 MG/ML  SOLN COMPARISON:  9 in 925 FINDINGS: Cardiovascular: The heart is unremarkable without pericardial effusion. No evidence of thoracic aortic aneurysm or dissection. No evidence of vascular injury. Atherosclerosis of the aorta and coronary vasculature. Mediastinum/Nodes: No enlarged mediastinal, hilar, or axillary lymph nodes. Thyroid gland, trachea, and esophagus demonstrate no significant findings. Lungs/Pleura: Right-sided pneumothorax is again identified, volume estimated 25%. No tension effect or midline shift. Trace free-flowing right pleural effusion. Dependent atelectasis within the right lower lobe. Multiple pulmonary nodules as follows: Right middle lobe, subpleural, image 108/3, 12 x 10 x 4 mm.  Right middle lobe, subpleural, image 126/3, 6 x 3 x 5 mm. Right upper lobe, image 89/3, 11 x 11 x 8 mm. The left lung is clear, with no left-sided effusion or left pneumothorax. The central airways are patent. Upper Abdomen: No acute abnormality. Thickening of the bilateral adrenal glands, measuring 1.5 cm on the right with attenuation of 37 HU and 3.3 cm on the left with attenuation of 32 HU. Musculoskeletal: There are minimally displaced right anterior second through fifth rib fractures. There are displaced right lateral fourth through eighth rib fractures. Fractures are also seen through the right posterior second through eighth ribs at the costovertebral junctions. No left-sided rib fractures. Mild diffuse cervical and thoracic spondylosis. Reconstructed images demonstrate no additional findings. IMPRESSION: 1. Right-sided pneumothorax, volume estimated 25%. No tension effect or midline shift. 2. Trace right free-flowing pleural effusion. 3. Numerous right-sided rib fractures as above. 4. Multiple pulmonary nodules. Most significant: Right upper lobe solid pulmonary nodule measuring 10 mm. Per Fleischner Society Guidelines, consider a non-contrast Chest CT  at 3 months, a PET/CT, or tissue sampling. These guidelines do not apply to immunocompromised patients and patients with cancer. Follow up in patients with significant comorbidities as clinically warranted. For lung cancer screening, adhere to Lung-RADS guidelines. Reference: Radiology. 2017; 284(1):228-43. 5. Aortic Atherosclerosis (ICD10-I70.0). Coronary artery atherosclerosis. 6. Nonspecific low-attenuation bilateral adrenal gland thickening, most likely representing adrenal hyperplasia. Electronically Signed   By: Ozell Daring M.D.   On: 10/14/2023 18:18   DG Ribs Unilateral W/Chest Right Result Date: 10/14/2023 CLINICAL DATA:  [name] 3 days ago, concern for pneumothorax EXAM: RIGHT RIBS AND CHEST - 3+ VIEW COMPARISON:  02/08/2006 FINDINGS: Frontal view  of the chest as well as frontal and oblique views of the right thoracic cage are obtained. The cardiac silhouette is unremarkable. There are displaced right lateral third through eighth rib fractures. There is an associated right-sided pneumothorax, volume estimated 50%. No mediastinal shift or tension effect. Small right effusion. No acute airspace disease. The left chest is clear. IMPRESSION: 1. Right-sided pneumothorax, volume estimated 50%. No tension effect or midline shift. 2. Displaced right lateral third through eighth rib fractures. 3. Trace right effusion. Critical Value/emergent results were called by telephone at the time of interpretation on 10/14/2023 at 3:15 pm to provider [facility], who verbally acknowledged these results. Electronically Signed   By: Ozell Daring M.D.   On: 10/14/2023 15:21    Microbiology: No results found for this or any previous visit (from the past 240 hours).   Labs: CBC: Recent Labs  Lab 10/14/23 1410 10/15/23 0536 10/16/23 0334  WBC 15.1* 15.4* 10.7*  NEUTROABS 14.2*  --   --   HGB 15.0 13.4 13.6  HCT 43.8 39.4 39.6  MCV 94.6 93.8 93.2  PLT 167 151 148*   Basic Metabolic Panel: Recent Labs  Lab 10/14/23 1410 10/15/23 0536 10/16/23 0334  NA 139 141 141  K 4.2 4.1 3.7  CL 102 105 102  CO2 28 28 29   GLUCOSE 129* 103* 90  BUN 29* 24* 29*  CREATININE 0.89 0.75 0.91  CALCIUM 9.7 9.3 9.1  MG  --  2.0 2.1  PHOS  --  3.4 3.9   Liver Function Tests: No results for input(s): AST, ALT, ALKPHOS, BILITOT, PROT, ALBUMIN in the last 168 hours. No results for input(s): LIPASE, AMYLASE in the last 168 hours. No results for input(s): AMMONIA in the last 168 hours. Cardiac Enzymes: No results for input(s): CKTOTAL, CKMB, CKMBINDEX, TROPONINI in the last 168 hours. BNP (last 3 results) No results for input(s): BNP in the last 8760 hours. CBG: No results for input(s): GLUCAP in the last 168 hours.  Time spent: 35  minutes  Signed:  Elvan Sor  [facility] 10/16/2023 2:43 PM

## 2023-10-16 NOTE — Evaluation (Signed)
 Physical Therapy Evaluation Patient Details Name: Logan Carpenter MRN: [id number] DOB: 22-Dec-1950 Today's Date: 10/16/2023  History of Present Illness  Pt is a 73 y.o. male with medical history significant for essential hypertension, who presented to the emergency room with persistent right-sided chest pain mainly with movement and postural changes.  Pt had a mechanical fall on when he landed on his right side.  MD assessment includes: closed traumatic fracture of ribs of right side with pneumothorax s/p chest tube placement and leukocytosis.   Clinical Impression  Pt was pleasant and motivated to participate during the session and put forth good effort throughout. Pt required cuing for sequencing with log roll training and transfers with a RW but no physical assist.  Pt was steady with standing activities including without an AD and was able to amb 200+ feet with no adverse symptoms.  Pt will benefit from continued PT services upon discharge to safely address deficits listed in patient problem list for decreased caregiver assistance and eventual return to PLOF.          If plan is discharge home, recommend the following: A little help with walking and/or transfers;A little help with bathing/dressing/bathroom;Assistance with cooking/housework;Help with stairs or ramp for entrance;Assist for transportation   Can travel by private vehicle        Equipment Recommendations None recommended by PT  Recommendations for Other Services       Functional Status Assessment Patient has had a recent decline in their functional status and demonstrates the ability to make significant improvements in function in a reasonable and predictable amount of time.     Precautions / Restrictions Precautions Precautions: Fall Restrictions Weight Bearing Restrictions Per Provider Order: No Other Position/Activity Restrictions: R-sided chest tube      Mobility  Bed Mobility Overal bed mobility: Needs  Assistance Bed Mobility: Rolling, Sidelying to Sit Rolling: Supervision Sidelying to sit: Supervision       General bed mobility comments: Log roll sequencing to the left for pain control    Transfers Overall transfer level: Needs assistance Equipment used: Rolling walker (2 wheels) Transfers: Sit to/from Stand Sit to Stand: Supervision           General transfer comment: Min verbal cues for hand placement    Ambulation/Gait Ambulation/Gait assistance: Supervision Gait Distance (Feet): 200 Feet Assistive device: Rolling walker (2 wheels) Gait Pattern/deviations: Step-through pattern, Decreased step length - right, Decreased step length - left Gait velocity: decreased     General Gait Details: Mildly reduced cadence but steady throughout with no overt LOB; min verbal cues for general sequencing with the RW  Stairs            Wheelchair Mobility     Tilt Bed    Modified Rankin (Stroke Patients Only)       Balance Overall balance assessment: No apparent balance deficits (not formally assessed)                                           Pertinent Vitals/Pain Pain Assessment Pain Assessment: No/denies pain    Home Living Family/patient expects to be discharged to:: Private residence Living Arrangements: Alone Available Help at Discharge: Family;Available 24 hours/day Type of Home: House Home Access: Stairs to enter Entrance Stairs-Rails: Right Entrance Stairs-Number of Steps: 1 Alternate Level Stairs-Number of Steps: flight Home Layout: Two level;Able to live on main level with  bedroom/bathroom Home Equipment: Grab bars - tub/shower;Grab bars - toilet;Cane - single point;Rollator (4 wheels);Rolling Walker (2 wheels);Shower seat;BSC/3in1 Additional Comments[place], family available to assist upon discharge    Prior Function Prior Level of Function : Independent/Modified Independent             Mobility Comments: Ind  amb without an AD community distances, no other fall history other than current fall that occured while pulling up boards on his deck during full deck rebuild, very active ADLs Comments: Ind with ADLs     Extremity/Trunk Assessment   Upper Extremity Assessment Upper Extremity Assessment: Defer to OT evaluation    Lower Extremity Assessment Lower Extremity Assessment: Overall WFL for tasks assessed    Cervical / Trunk Assessment Cervical / Trunk Assessment: Normal  Communication   Communication Communication: No apparent difficulties    Cognition Arousal: Alert Behavior During Therapy: WFL for tasks assessed/performed   PT - Cognitive impairments: No apparent impairments                         Following commands: Intact       Cueing Cueing Techniques: Verbal cues     General Comments General comments (skin integrity, edema, etc.): R chest tube clamped. Intact start/end of session. VSS on RA.    Exercises Other Exercises Other Exercises: Log roll training   Assessment/Plan    PT Assessment Patient needs continued PT services  PT Problem List Decreased activity tolerance       PT Treatment Interventions DME instruction;Gait training;Stair training;Functional mobility training;Therapeutic activities;Therapeutic exercise;Patient/family education;Balance training    PT Goals (Current goals can be found in the Care Plan section)  Acute Rehab PT Goals Patient Stated Goal: To get back to being self sufficient PT Goal Formulation: With patient Time For Goal Achievement: 10/29/23 Potential to Achieve Goals: Good    Frequency Min 2X/week     Co-evaluation               AM-PAC PT 6 Clicks Mobility  Outcome Measure Help needed turning from your back to your side while in a flat bed without using bedrails?: A Little Help needed moving from lying on your back to sitting on the side of a flat bed without using bedrails?: A Little Help needed moving  to and from a bed to a chair (including a wheelchair)?: A Little Help needed standing up from a chair using your arms (e.g., wheelchair or bedside chair)?: A Little Help needed to walk in hospital room?: A Little Help needed climbing 3-5 steps with a railing? : A Little 6 Click Score: 18    End of Session Equipment Utilized During Treatment: Other (comment) (No belt used due to chest tube placement position) Activity Tolerance: Patient tolerated treatment well Patient left: in chair;Other (comment) (Pt left with OT entering room for OT evaluation) Nurse Communication: Mobility status PT Visit Diagnosis: Difficulty in walking, not elsewhere classified (R26.2)    Time: 9145-9069 PT Time Calculation (min) (ACUTE ONLY): 36 min   Charges:   PT Evaluation $PT Eval Moderate Complexity: 1 Mod PT Treatments $Therapeutic Activity: 8-22 mins PT General Charges $$ ACUTE PT VISIT: 1 Visit    D. Glendia Bertin PT, DPT 10/16/23, 10:47 AM

## 2023-10-16 NOTE — Progress Notes (Signed)
 Discharge instructions given to patient, questions answered. IV removed without complications. Patient transporting home via car by family. Chest tube removed by MD, dressing in place. Extra dressing given per MD request.

## 2023-10-16 NOTE — Progress Notes (Signed)
 PULMONOLOGY         Date: 10/16/2023,   MRN# [id number] Logan Carpenter [birth year]     AdmissionWeight: 61.2 kg                 CurrentWeight: 61.2 kg  Referring provider: Dr Von   CHIEF COMPLAINT:   Traumatic Pneumothorax    HISTORY OF PRESENT ILLNESS   73 yo male with medical history significant for HTN who came in with R chest pain x 4 days. Did not have fevers or flu like illness or respiratory distress.   No fever or chills.  No nausea or vomiting or abdominal pain.  He reports falls 1 wk prior to admission.   No syncope or syncope.  No dysuria, oliguria or hematuria or flank pain.  No other bleeding diathesis. The patient had CXR with findings concerning for pneumothroax.  He had CT chest with confirmation of pneumothorax and Right 5-8th rib fractures.  He also has multiple lung nodules including in RUL and RML.    10/16/23- patient is clinically improved.  Chest tube evaluation showed no air leak with forced expiratory maneuver.  He had chest tube clamped today and repeat CXR post 3 hour time period showed trace apical pneumothorax.  Plan to remove chest tube today with outpatient follow up for lung nodules.    PAST MEDICAL HISTORY   Past Medical History:  Diagnosis Date   Hx of adenomatous colonic polyps 02/03/2020   Hypertension      SURGICAL HISTORY   Past Surgical History:  Procedure Laterality Date   COLONOSCOPY     neck surgery     broke neck- has screw and bone graft    TONSILLECTOMY     age 74      FAMILY HISTORY   Family History  Problem Relation Age of Onset   Colon cancer Neg Hx    Colon polyps Neg Hx    Esophageal cancer Neg Hx    Rectal cancer Neg Hx    Stomach cancer Neg Hx      SOCIAL HISTORY   Social History   Tobacco Use   Smoking status: Some Days    Types: Cigars   Smokeless tobacco: Former  Building services engineer status: Never Used  Substance Use Topics   Alcohol use: Yes    Comment: social    Drug use: Never      MEDICATIONS    Home Medication:     Current Medication:  Current Facility-Administered Medications:    acetaminophen  (TYLENOL ) tablet 650 mg, 650 mg, Oral, Q6H PRN **OR** acetaminophen  (TYLENOL ) suppository 650 mg, 650 mg, Rectal, Q6H PRN, Mansy, Jan A, MD   B-complex with vitamin C tablet 1 tablet, 1 tablet, Oral, Daily, Lenon Elsie HERO, [facility], 1 tablet at 10/15/23 9093   enoxaparin  (LOVENOX ) injection 40 mg, 40 mg, Subcutaneous, Q24H, Mansy, Jan A, MD, 40 mg at 10/15/23 2122   furosemide  (LASIX ) injection 40 mg, 40 mg, Intravenous, Daily, Jolanda Mccann, MD, 40 mg at 10/15/23 1649   ipratropium-albuterol  (DUONEB) 0.5-2.5 (3) MG/3ML nebulizer solution 3 mL, 3 mL, Nebulization, Q6H PRN, Von Bellis, MD   lisinopril  (ZESTRIL ) tablet 2.5 mg, 2.5 mg, Oral, Daily, Katanya Schlie, MD   magnesium  hydroxide (MILK OF MAGNESIA) suspension 30 mL, 30 mL, Oral, Daily PRN, Mansy, Jan A, MD   morphine  (PF) 2 MG/ML injection 2 mg, 2 mg, Intravenous, Q4H PRN, Mansy, Jan A, MD   multivitamin with minerals tablet 1  tablet, 1 tablet, Oral, Daily, Mansy, Jan A, MD, 1 tablet at 10/15/23 0906   ondansetron  (ZOFRAN ) tablet 4 mg, 4 mg, Oral, Q6H PRN **OR** ondansetron  (ZOFRAN ) injection 4 mg, 4 mg, Intravenous, Q6H PRN, Mansy, Jan A, MD   oxyCODONE -acetaminophen  (PERCOCET/ROXICET) 5-325 MG per tablet 1-2 tablet, 1-2 tablet, Oral, Q4H PRN, Mansy, Jan A, MD, 2 tablet at 10/15/23 2122   traZODone  (DESYREL ) tablet 25 mg, 25 mg, Oral, QHS PRN, Mansy, Jan A, MD, 25 mg at 10/15/23 0133    ALLERGIES   Patient has no known allergies.     REVIEW OF SYSTEMS    Review of Systems:  Gen:  Denies  fever, sweats, chills weigh loss  HEENT: Denies blurred vision, double vision, ear pain, eye pain, hearing loss, nose bleeds, sore throat Cardiac:  No dizziness, chest pain or heaviness, chest tightness,edema Resp:   reports dyspnea chronically  Gi: Denies swallowing difficulty, stomach pain, nausea or  vomiting, diarrhea, constipation, bowel incontinence Gu:  Denies bladder incontinence, burning urine Ext:   Denies Joint pain, stiffness or swelling Skin: Denies  skin rash, easy bruising or bleeding or hives Endoc:  Denies polyuria, polydipsia , polyphagia or weight change Psych:   Denies depression, insomnia or hallucinations   Other:  All other systems negative   VS: BP 136/66 (BP Location: Left Arm)   Pulse (!) 56   Temp 98.2 F (36.8 C)   Resp 18   Ht 5' 9 (1.753 m)   Wt 61.2 kg   SpO2 100%   BMI 19.94 kg/m      PHYSICAL EXAM    GENERAL:NAD, no fevers, chills, no weakness no fatigue HEAD: Normocephalic, atraumatic.  EYES: Pupils equal, round, reactive to light. Extraocular muscles intact. No scleral icterus.  MOUTH: Moist mucosal membrane. Dentition intact. No abscess noted.  EAR, NOSE, THROAT: Clear without exudates. No external lesions.  NECK: Supple. No thyromegaly. No nodules. No JVD.  PULMONARY: decreased breath sounds with mild rhonchi worse at bases bilaterally.  CARDIOVASCULAR: S1 and S2. Regular rate and rhythm. No murmurs, rubs, or gallops. No edema. Pedal pulses 2+ bilaterally.  GASTROINTESTINAL: Soft, nontender, nondistended. No masses. Positive bowel sounds. No hepatosplenomegaly.  MUSCULOSKELETAL: No swelling, clubbing, or edema. Range of motion full in all extremities.  NEUROLOGIC: Cranial nerves II through XII are intact. No gross focal neurological deficits. Sensation intact. Reflexes intact.  SKIN: No ulceration, lesions, rashes, or cyanosis. Skin warm and dry. Turgor intact.  PSYCHIATRIC: Mood, affect within normal limits. The patient is awake, alert and oriented x 3. Insight, judgment intact.       IMAGING   @IMAGES @ arrative & Impression CLINICAL DATA:  [name] 3 days ago, pneumothorax and right rib fracture seen on earlier x-ray   EXAM: CT CHEST WITH CONTRAST   TECHNIQUE: Multidetector CT imaging of the chest was performed  during intravenous contrast administration.   RADIATION DOSE REDUCTION: This exam was performed according to the departmental dose-optimization program which includes automated exposure control, adjustment of the mA and/or kV according to patient size and/or use of iterative reconstruction technique.   CONTRAST:  75mL OMNIPAQUE  IOHEXOL  300 MG/ML  SOLN   COMPARISON:  9 in 925   FINDINGS: Cardiovascular: The heart is unremarkable without pericardial effusion. No evidence of thoracic aortic aneurysm or dissection. No evidence of vascular injury. Atherosclerosis of the aorta and coronary vasculature.   Mediastinum/Nodes: No enlarged mediastinal, hilar, or axillary lymph nodes. Thyroid gland, trachea, and esophagus demonstrate no significant findings.   Lungs/Pleura:  Right-sided pneumothorax is again identified, volume estimated 25%. No tension effect or midline shift. Trace free-flowing right pleural effusion. Dependent atelectasis within the right lower lobe. Multiple pulmonary nodules as follows:   Right middle lobe, subpleural, image 108/3, 12 x 10 x 4 mm.   Right middle lobe, subpleural, image 126/3, 6 x 3 x 5 mm.   Right upper lobe, image 89/3, 11 x 11 x 8 mm.   The left lung is clear, with no left-sided effusion or left pneumothorax. The central airways are patent.   Upper Abdomen: No acute abnormality. Thickening of the bilateral adrenal glands, measuring 1.5 cm on the right with attenuation of 37 HU and 3.3 cm on the left with attenuation of 32 HU.   Musculoskeletal: There are minimally displaced right anterior second through fifth rib fractures. There are displaced right lateral fourth through eighth rib fractures. Fractures are also seen through the right posterior second through eighth ribs at the costovertebral junctions. No left-sided rib fractures. Mild diffuse cervical and thoracic spondylosis. Reconstructed images demonstrate no additional findings.    IMPRESSION: 1. Right-sided pneumothorax, volume estimated 25%. No tension effect or midline shift. 2. Trace right free-flowing pleural effusion. 3. Numerous right-sided rib fractures as above. 4. Multiple pulmonary nodules. Most significant: Right upper lobe solid pulmonary nodule measuring 10 mm. Per Fleischner Society Guidelines, consider a non-contrast Chest CT at 3 months, a PET/CT, or tissue sampling. These guidelines do not apply to immunocompromised patients and patients with cancer. Follow up in patients with significant comorbidities as clinically warranted. For lung cancer screening, adhere to Lung-RADS guidelines. Reference: Radiology. 2017; 284(1):228-43. 5. Aortic Atherosclerosis (ICD10-I70.0). Coronary artery atherosclerosis. 6. Nonspecific low-attenuation bilateral adrenal gland thickening, most likely representing adrenal hyperplasia.     Electronically Signed   By: Ozell Daring M.D.   On: 10/14/2023 18:18      ASSESSMENT/PLAN   Traumatic right pneumothorax                 - s/p chest tube insertion - negative air leak with forced expiratory maneuver.  Pneumothorax with no recurrence post clamping and plan is to remove chest tube. 2. Right sided rib fractures - supportive care only , [place] 3. Right upper and middle lobe lung nodules - will likely need biopsy on outpatient 4. Right pleural effusion - not big enough to aspirate recommendation for diuresis, currently patient is getting IVF at 75/hr.  I have stopped this and will add lasix  IV.  I have reduced lisinopril  to 2.5mg  to avoid transient hypotension while diuresing. 5. Centrilobular emphysema - Duoneb q6h PRN 6.GI/dvt ppx - protonix and lovenox         Thank you for allowing me to participate in the care of this patient.   Patient/Family are satisfied with care plan and all questions have been answered.    Provider disclosure: Patient with at least one acute or chronic illness or injury  that poses a threat to life or bodily function and is being managed actively during this encounter.  All of the below services have been performed independently by signing provider:  review of prior documentation from internal and or external health records.  Review of previous and current lab results.  Interview and comprehensive assessment during patient visit today. Review of current and previous chest radiographs/CT scans. Discussion of management and test interpretation with health care team and patient/family.   This document was prepared using Dragon voice recognition software and may include unintentional dictation errors.  Macalister Arnaud, M.D.  Division of Pulmonary & Critical Care Medicine

## 2023-10-16 NOTE — Plan of Care (Signed)

## 2023-10-16 NOTE — Care Management Important Message (Signed)
 Important Message  Patient Details  Name: Logan Carpenter MRN: [id number] Date of Birth: 21-Aug-1950   Important Message Given:  Yes - Medicare IM     [name] 10/16/2023, 12:37 PM

## 2023-10-16 NOTE — Evaluation (Signed)
 Occupational Therapy Evaluation Patient Details Name: Logan Carpenter MRN: [id number] DOB: 13-Sep-1950 Today's Date: 10/16/2023   History of Present Illness   Pt is a 73 y.o. male with medical history significant for essential hypertension, who presented to the emergency room with persistent right-sided chest pain mainly with movement and postural changes.  Pt had a mechanical fall on when he landed on his right side.  MD assessment includes: closed traumatic fracture of ribs of right side with pneumothorax s/p chest tube placement and leukocytosis.     Clinical Impressions Pt admitted with above. Prior to admission, pt was independent in ADLs/IADLs and driving. Pt active with recent fall occurring while pt was rebuilding his deck. Pt has excellent family support system at discharge. Pt received in recliner, and was supervision for mobility/STS transfers, ambulated to bathroom for toileting needs requiring cues only for sequencing and assist for chest tube mgmt. Pt educated on body mechanics and breathing techniques to reduce pain and improve comfort with positional changes, pt reporting decrease in pain symptoms after return demo. Pt used LB AD to doff/don socks with MIN A. P Pt would benefit from skilled OT services to address noted impairments and functional limitations (see below for any additional details) in order to maximize safety and independence while minimizing falls risk and caregiver burden. Anticipate the need for follow up [facility] OT services upon acute hospital DC.      If plan is discharge home, recommend the following:   A little help with walking and/or transfers;A little help with bathing/dressing/bathroom;Assist for transportation;Help with stairs or ramp for entrance;Assistance with cooking/housework     Functional Status Assessment   Patient has had a recent decline in their functional status and demonstrates the ability to make significant improvements in function in a  reasonable and predictable amount of time.     Equipment Recommendations   Other (comment) (sock aide, elastic shoelaces, shoe funnel)      Precautions/Restrictions   Precautions Precautions: Fall Recall of Precautions/Restrictions: Intact Precaution/Restrictions Comments: R chest tube (clamped) Restrictions Weight Bearing Restrictions Per Provider Order: No     Mobility Bed Mobility Overal bed mobility: Needs Assistance             General bed mobility comments: NT, pt recieved upright in recliner and left in recliner    Transfers Overall transfer level: Needs assistance Equipment used: Rolling walker (2 wheels), None Transfers: Sit to/from Stand Sit to Stand: Supervision           General transfer comment: pt was able to perform STS transfer without use of hands, cues for sequencing and breathing techniques to minimize pain with transitional movements      Balance Overall balance assessment: Needs assistance Sitting-balance support: Feet supported, No upper extremity supported Sitting balance-Leahy Scale: Normal     Standing balance support: No upper extremity supported, During functional activity Standing balance-Leahy Scale: Good Standing balance comment: good balance while standing for continent urine void in bathroom                           ADL either performed or assessed with clinical judgement   ADL Overall ADL's : Needs assistance/impaired Eating/Feeding: Independent;Sitting   Grooming: Standing;Supervision/safety   Upper Body Bathing: Sitting;Supervision/ safety   Lower Body Bathing: Supervison/ safety;Sit to/from stand   Upper Body Dressing : Sitting;Supervision/safety   Lower Body Dressing: Minimal assistance;With adaptive equipment;Cueing for safety;Cueing for sequencing;Sit to/from stand Lower Body Dressing Details (  indicate cue type and reason): discussed use of AE for LB dressing due to discomfort in R rib cage.  Provided visual + verbal demo of sock aide (pt recently purchased reacher for home use), and pt was able to don / doff socks using AE with MIN A, cues for efficiency. Pt reports less pain and improved comfort with AE use. Discussed use of elastic shoelaces for sneakers. Toilet Transfer: Supervision/safety;Rolling walker (2 wheels) Toilet Transfer Details (indicate cue type and reason): t/f bathroom using RW (chest tube placed on RW) Toileting- Clothing Manipulation and Hygiene: Supervision/safety;Sit to/from stand Toileting - Clothing Manipulation Details (indicate cue type and reason): stands for continent urine void, supervision to manage clothing     Functional mobility during ADLs: Supervision/safety;Rolling walker (2 wheels) General ADL Comments: edu on breathing techniques and body mechanices (exhalation and log roll technique) to minimize pain. pt reports at home his largest limiting factor was sharp, shooting/stabbing rib pain with transitional movements and LB dressing. pt was able to return demo with improved pain and verbalizes understanding     Vision Baseline Vision/History: 1 Wears glasses Ability to See in Adequate Light: 0 Adequate Patient Visual Report: No change from baseline Vision Assessment?: Wears glasses for reading            Pertinent Vitals/Pain Pain Assessment Pain Assessment: Faces Faces Pain Scale: Hurts a little bit Pain Location: R rib fxs Pain Descriptors / Indicators: Guarding, Discomfort Pain Intervention(s): Limited activity within patient's tolerance, Monitored during session     Extremity/Trunk Assessment Upper Extremity Assessment Upper Extremity Assessment: Defer to OT evaluation   Lower Extremity Assessment Lower Extremity Assessment: Overall WFL for tasks assessed   Cervical / Trunk Assessment Cervical / Trunk Assessment: Normal   Communication Communication Communication: No apparent difficulties   Cognition Arousal: Alert Behavior  During Therapy: WFL for tasks assessed/performed Cognition: No apparent impairments                               Following commands: Intact       Cueing  General Comments   Cueing Techniques: Verbal cues  R chest tube clamped. Intact start/end of session. VSS on RA.           Home Living Family/patient expects to be discharged to:: Private residence Living Arrangements: Alone Available Help at Discharge: Family;Available 24 hours/day Type of Home: House Home Access: Stairs to enter [employer] of Steps: 1 Entrance Stairs-Rails: Right Home Layout: Two level;Able to live on main level with bedroom/bathroom Alternate Level Stairs-Number of Steps: flight   Bathroom Shower/Tub: Chief Strategy Officer: Standard     Home Equipment: Grab bars - tub/shower;Grab bars - toilet;Cane - single point;Rollator (4 wheels);Rolling Walker (2 wheels);Shower seat;BSC/3in1 [employer] Equipment: Educational psychologist Comments[place], family available to assist upon discharge      Prior Functioning/Environment Prior Level of Function : Independent/Modified Independent             Mobility Comments: Ind amb without an AD community distances, no other fall history other than current fall that occured while pulling up boards on his deck during full deck rebuild, very active ADLs Comments: Ind with ADLs    OT Problem List: Decreased range of motion;Decreased activity tolerance;Pain;Decreased knowledge of precautions;Decreased knowledge of use of DME or AE;Impaired balance (sitting and/or standing)   OT Treatment/Interventions: Self-care/ADL training;Therapeutic exercise;Neuromuscular education;Energy conservation;DME and/or AE instruction;Therapeutic activities;Patient/family education;Balance training  OT Goals(Current goals can be found in the care plan section)   Acute Rehab OT Goals OT Goal Formulation: With patient Time For Goal  Achievement: 10/30/23 Potential to Achieve Goals: Good ADL Goals Pt Will Perform Grooming: with modified independence;standing Pt Will Perform Lower Body Bathing: with modified independence;with adaptive equipment;sit to/from stand;sitting/lateral leans Pt Will Perform Lower Body Dressing: with modified independence;sitting/lateral leans;sit to/from stand;with adaptive equipment Pt Will Transfer to Toilet: with modified independence;ambulating;regular height toilet;grab bars Pt Will Perform Toileting - Clothing Manipulation and hygiene: with modified independence;sit to/from stand;sitting/lateral leans;with adaptive equipment Pt Will Perform Tub/Shower Transfer: with modified independence;Shower transfer;grab bars;shower seat;ambulating   OT Frequency:  Min 2X/week       AM-PAC OT 6 Clicks Daily Activity     Outcome Measure Help from another person eating meals?: None Help from another person taking care of personal grooming?: None Help from another person toileting, which includes using toliet, bedpan, or urinal?: A Little Help from another person bathing (including washing, rinsing, drying)?: A Little Help from another person to put on and taking off regular upper body clothing?: None Help from another person to put on and taking off regular lower body clothing?: A Little 6 Click Score: 21   End of Session Equipment Utilized During Treatment: Rolling walker (2 wheels) Nurse Communication: Mobility status  Activity Tolerance: Patient tolerated treatment well;No increased pain Patient left: in chair;with call bell/phone within reach;with chair alarm set  OT Visit Diagnosis: History of falling (Z91.81);Other abnormalities of gait and mobility (R26.89);Pain Pain - Right/Left: Right Pain - part of body:  (ribs)                Time: 9068-9040 OT Time Calculation (min): 28 min Charges:  OT General Charges $OT Visit: 1 Visit OT Evaluation $OT Eval Low Complexity: 1 Low OT  Treatments $Self Care/Home Management : 8-22 mins Logan Carpenter, OTR/L  10/16/23, 10:27 AM

## 2023-10-16 NOTE — Progress Notes (Signed)
 Orders given to clamp chest tube, orders followed. Patient tolerated well. Repeat chest xray timed at 1100.

## 2023-10-16 NOTE — Progress Notes (Signed)
 Mobility Specialist - Progress Note   10/16/23 1150  Mobility  Activity Ambulated with assistance  Level of Assistance Standby assist, set-up cues, supervision of patient - no hands on  Assistive Device None  Distance Ambulated (ft) 24 ft  Activity Response Tolerated well  Mobility visit 1 Mobility  Mobility Specialist Start Time (ACUTE ONLY) 1006  Mobility Specialist Stop Time (ACUTE ONLY) 1012  Mobility Specialist Time Calculation (min) (ACUTE ONLY) 6 min   Pt amb to/from the bathroom with supervision, MinA for line management. Pt returned to the chair, left seated with alarm set and needs within reach.   America Silvan Mobility Specialist 10/16/23 11:58 AM
